# Patient Record
Sex: Female | Born: 1935
Health system: Southern US, Community
[De-identification: ages and names within clinical notes are randomized; demographics above are authoritative.]

## PROBLEM LIST (undated history)

## (undated) DIAGNOSIS — E785 Hyperlipidemia, unspecified: Secondary | ICD-10-CM

## (undated) DIAGNOSIS — K5790 Diverticulosis of intestine, part unspecified, without perforation or abscess without bleeding: Secondary | ICD-10-CM

## (undated) DIAGNOSIS — Z853 Personal history of malignant neoplasm of breast: Secondary | ICD-10-CM

## (undated) DIAGNOSIS — I059 Rheumatic mitral valve disease, unspecified: Secondary | ICD-10-CM

## (undated) DIAGNOSIS — R011 Cardiac murmur, unspecified: Secondary | ICD-10-CM

## (undated) DIAGNOSIS — D696 Thrombocytopenia, unspecified: Secondary | ICD-10-CM

## (undated) DIAGNOSIS — C9110 Chronic lymphocytic leukemia of B-cell type not having achieved remission: Secondary | ICD-10-CM

## (undated) DIAGNOSIS — M2042 Other hammer toe(s) (acquired), left foot: Secondary | ICD-10-CM

## (undated) DIAGNOSIS — M8580 Other specified disorders of bone density and structure, unspecified site: Secondary | ICD-10-CM

## (undated) DIAGNOSIS — I10 Essential (primary) hypertension: Secondary | ICD-10-CM

## (undated) DIAGNOSIS — Z8669 Personal history of other diseases of the nervous system and sense organs: Secondary | ICD-10-CM

## (undated) DIAGNOSIS — Z8781 Personal history of (healed) traumatic fracture: Secondary | ICD-10-CM

## (undated) DIAGNOSIS — R413 Other amnesia: Secondary | ICD-10-CM

## (undated) HISTORY — DX: Personal history of other diseases of the nervous system and sense organs: Z86.69

## (undated) HISTORY — DX: Other amnesia: R41.3

## (undated) HISTORY — DX: Thrombocytopenia, unspecified: D69.6

## (undated) HISTORY — DX: Hyperlipidemia, unspecified: E78.5

## (undated) HISTORY — DX: Essential (primary) hypertension: I10

## (undated) HISTORY — DX: Cardiac murmur, unspecified: R01.1

## (undated) HISTORY — DX: Chronic lymphocytic leukemia of B-cell type not having achieved remission: C91.10

## (undated) HISTORY — DX: Rheumatic mitral valve disease, unspecified: I05.9

## (undated) HISTORY — DX: Personal history of malignant neoplasm of breast: Z85.3

## (undated) HISTORY — DX: Other specified disorders of bone density and structure, unspecified site: M85.80

## (undated) HISTORY — DX: Other hammer toe(s) (acquired), left foot: M20.42

## (undated) HISTORY — DX: Diverticulosis of intestine, part unspecified, without perforation or abscess without bleeding: K57.90

## (undated) HISTORY — DX: Personal history of (healed) traumatic fracture: Z87.81

---

## 1996-12-08 HISTORY — PX: MASTECTOMY: SHX3

## 1998-04-13 ENCOUNTER — Other Ambulatory Visit: Admission: RE | Admit: 1998-04-13 | Discharge: 1998-04-13 | Payer: Self-pay | Admitting: Obstetrics & Gynecology

## 1998-05-18 ENCOUNTER — Ambulatory Visit (HOSPITAL_COMMUNITY): Admission: RE | Admit: 1998-05-18 | Discharge: 1998-05-18 | Payer: Self-pay | Admitting: Internal Medicine

## 1998-09-11 ENCOUNTER — Encounter: Payer: Self-pay | Admitting: Internal Medicine

## 1998-09-11 ENCOUNTER — Ambulatory Visit (HOSPITAL_COMMUNITY): Admission: RE | Admit: 1998-09-11 | Discharge: 1998-09-11 | Payer: Self-pay | Admitting: Internal Medicine

## 1998-11-21 ENCOUNTER — Other Ambulatory Visit: Admission: RE | Admit: 1998-11-21 | Discharge: 1998-11-21 | Payer: Self-pay | Admitting: Hematology and Oncology

## 1999-04-15 ENCOUNTER — Other Ambulatory Visit: Admission: RE | Admit: 1999-04-15 | Discharge: 1999-04-15 | Payer: Self-pay | Admitting: Obstetrics & Gynecology

## 1999-05-22 ENCOUNTER — Encounter: Payer: Self-pay | Admitting: Internal Medicine

## 1999-05-22 ENCOUNTER — Ambulatory Visit (HOSPITAL_COMMUNITY): Admission: RE | Admit: 1999-05-22 | Discharge: 1999-05-22 | Payer: Self-pay | Admitting: Internal Medicine

## 2000-05-11 ENCOUNTER — Other Ambulatory Visit: Admission: RE | Admit: 2000-05-11 | Discharge: 2000-05-11 | Payer: Self-pay | Admitting: Obstetrics & Gynecology

## 2002-02-17 ENCOUNTER — Encounter: Payer: Self-pay | Admitting: Internal Medicine

## 2002-02-17 ENCOUNTER — Encounter: Admission: RE | Admit: 2002-02-17 | Discharge: 2002-02-17 | Payer: Self-pay | Admitting: Internal Medicine

## 2002-05-04 ENCOUNTER — Ambulatory Visit (HOSPITAL_COMMUNITY): Admission: RE | Admit: 2002-05-04 | Discharge: 2002-05-04 | Payer: Self-pay | Admitting: Internal Medicine

## 2002-05-04 ENCOUNTER — Encounter: Admission: RE | Admit: 2002-05-04 | Discharge: 2002-08-02 | Payer: Self-pay | Admitting: Hematology and Oncology

## 2002-11-09 ENCOUNTER — Encounter: Admission: RE | Admit: 2002-11-09 | Discharge: 2002-11-11 | Payer: Self-pay | Admitting: Hematology and Oncology

## 2003-08-23 ENCOUNTER — Other Ambulatory Visit: Admission: RE | Admit: 2003-08-23 | Discharge: 2003-08-23 | Payer: Self-pay | Admitting: Obstetrics & Gynecology

## 2004-09-25 ENCOUNTER — Other Ambulatory Visit: Admission: RE | Admit: 2004-09-25 | Discharge: 2004-09-25 | Payer: Self-pay | Admitting: Obstetrics & Gynecology

## 2004-11-06 ENCOUNTER — Ambulatory Visit: Payer: Self-pay | Admitting: Hematology & Oncology

## 2005-01-31 ENCOUNTER — Ambulatory Visit: Payer: Self-pay | Admitting: Hematology & Oncology

## 2005-02-03 ENCOUNTER — Ambulatory Visit: Payer: Self-pay | Admitting: Hematology & Oncology

## 2005-05-30 ENCOUNTER — Ambulatory Visit: Payer: Self-pay | Admitting: Hematology & Oncology

## 2005-09-19 ENCOUNTER — Ambulatory Visit: Payer: Self-pay | Admitting: Hematology & Oncology

## 2005-10-22 ENCOUNTER — Other Ambulatory Visit: Admission: RE | Admit: 2005-10-22 | Discharge: 2005-10-22 | Payer: Self-pay | Admitting: Obstetrics & Gynecology

## 2006-01-02 ENCOUNTER — Ambulatory Visit: Payer: Self-pay | Admitting: Hematology & Oncology

## 2006-04-29 ENCOUNTER — Ambulatory Visit: Payer: Self-pay | Admitting: Hematology & Oncology

## 2006-05-01 LAB — CBC WITH DIFFERENTIAL/PLATELET
Eosinophils Absolute: 0.1 10*3/uL (ref 0.0–0.5)
HCT: 40.1 % (ref 34.8–46.6)
LYMPH%: 91.2 % — ABNORMAL HIGH (ref 14.0–48.0)
MONO#: 1 10*3/uL — ABNORMAL HIGH (ref 0.1–0.9)
NEUT#: 8.8 10*3/uL — ABNORMAL HIGH (ref 1.5–6.5)
NEUT%: 7.8 % — ABNORMAL LOW (ref 39.6–76.8)
Platelets: 144 10*3/uL — ABNORMAL LOW (ref 145–400)
WBC: 112.4 10*3/uL (ref 3.9–10.0)
lymph#: 102.5 10*3/uL — ABNORMAL HIGH (ref 0.9–3.3)

## 2006-05-01 LAB — CHCC SMEAR

## 2006-05-01 LAB — COMPREHENSIVE METABOLIC PANEL
AST: 19 U/L (ref 0–37)
Alkaline Phosphatase: 71 U/L (ref 39–117)
Glucose, Bld: 80 mg/dL (ref 70–99)
Total Bilirubin: 0.5 mg/dL (ref 0.3–1.2)
Total Protein: 6.1 g/dL (ref 6.0–8.3)

## 2006-05-01 LAB — TECHNOLOGIST REVIEW

## 2006-09-02 ENCOUNTER — Ambulatory Visit: Payer: Self-pay | Admitting: Hematology & Oncology

## 2006-09-04 LAB — CBC WITH DIFFERENTIAL/PLATELET
Basophils Absolute: 0.1 10*3/uL (ref 0.0–0.1)
EOS%: 0.2 % (ref 0.0–7.0)
Eosinophils Absolute: 0.2 10*3/uL (ref 0.0–0.5)
HGB: 13.2 g/dL (ref 11.6–15.9)
LYMPH%: 85.2 % — ABNORMAL HIGH (ref 14.0–48.0)
MCH: 31.6 pg (ref 26.0–34.0)
MCV: 97 fL (ref 81.0–101.0)
MONO%: 0.6 % (ref 0.0–13.0)
NEUT#: 16.6 10*3/uL — ABNORMAL HIGH (ref 1.5–6.5)
Platelets: 145 10*3/uL (ref 145–400)
RBC: 4.17 10*6/uL (ref 3.70–5.32)
RDW: 14.1 % (ref 11.3–14.5)

## 2006-09-04 LAB — COMPREHENSIVE METABOLIC PANEL
Alkaline Phosphatase: 68 U/L (ref 39–117)
BUN: 15 mg/dL (ref 6–23)
CO2: 27 mEq/L (ref 19–32)
Creatinine, Ser: 0.71 mg/dL (ref 0.40–1.20)
Glucose, Bld: 91 mg/dL (ref 70–99)
Total Bilirubin: 0.5 mg/dL (ref 0.3–1.2)

## 2006-09-04 LAB — LACTATE DEHYDROGENASE: LDH: 182 U/L (ref 94–250)

## 2006-12-29 ENCOUNTER — Ambulatory Visit: Payer: Self-pay | Admitting: Hematology & Oncology

## 2007-01-01 LAB — CBC WITH DIFFERENTIAL/PLATELET
Basophils Absolute: 0 10*3/uL (ref 0.0–0.1)
Eosinophils Absolute: 0 10*3/uL (ref 0.0–0.5)
HGB: 13.3 g/dL (ref 11.6–15.9)
LYMPH%: 89.4 % — ABNORMAL HIGH (ref 14.0–48.0)
MCV: 97.5 fL (ref 81.0–101.0)
MONO%: 0.7 % (ref 0.0–13.0)
NEUT#: 11.5 10*3/uL — ABNORMAL HIGH (ref 1.5–6.5)
Platelets: 123 10*3/uL — ABNORMAL LOW (ref 145–400)

## 2007-01-01 LAB — TECHNOLOGIST REVIEW

## 2007-01-02 ENCOUNTER — Emergency Department (HOSPITAL_COMMUNITY): Admission: EM | Admit: 2007-01-02 | Discharge: 2007-01-02 | Payer: Self-pay | Admitting: Emergency Medicine

## 2007-06-30 ENCOUNTER — Ambulatory Visit: Payer: Self-pay | Admitting: Hematology & Oncology

## 2007-07-02 LAB — MANUAL DIFFERENTIAL
ALC: 80.5 10*3/uL — ABNORMAL HIGH (ref 0.9–3.3)
ANC (CHCC manual diff): 2.5 10*3/uL (ref 1.5–6.5)
Band Neutrophils: 0 % (ref 0–10)
Blasts: 0 % (ref 0–0)
LYMPH: 97 % — ABNORMAL HIGH (ref 14–49)
MONO: 0 % (ref 0–14)
Other Cell: 0 % (ref 0–0)
Variant Lymph: 0 % (ref 0–0)
nRBC: 0 % (ref 0–0)

## 2007-07-02 LAB — CBC WITH DIFFERENTIAL/PLATELET
HCT: 38.1 % (ref 34.8–46.6)
MCV: 95.2 fL (ref 81.0–101.0)
Platelets: 140 10*3/uL — ABNORMAL LOW (ref 145–400)
RBC: 4.01 10*6/uL (ref 3.70–5.32)
WBC: 83 10*3/uL (ref 3.9–10.0)

## 2007-07-02 LAB — COMPREHENSIVE METABOLIC PANEL
ALT: 13 U/L (ref 0–35)
BUN: 15 mg/dL (ref 6–23)
CO2: 26 mEq/L (ref 19–32)
Calcium: 9.1 mg/dL (ref 8.4–10.5)
Chloride: 103 mEq/L (ref 96–112)
Creatinine, Ser: 0.73 mg/dL (ref 0.40–1.20)
Glucose, Bld: 78 mg/dL (ref 70–99)
Total Bilirubin: 0.5 mg/dL (ref 0.3–1.2)

## 2007-12-15 ENCOUNTER — Ambulatory Visit: Payer: Self-pay | Admitting: Hematology & Oncology

## 2007-12-17 LAB — CBC WITH DIFFERENTIAL/PLATELET
BASO%: 0.2 % (ref 0.0–2.0)
Eosinophils Absolute: 1.1 10*3/uL — ABNORMAL HIGH (ref 0.0–0.5)
HCT: 40.6 % (ref 34.8–46.6)
MCHC: 33.1 g/dL (ref 32.0–36.0)
MONO#: 0.5 10*3/uL (ref 0.1–0.9)
NEUT#: 26.3 10*3/uL — ABNORMAL HIGH (ref 1.5–6.5)
NEUT%: 25.4 % — ABNORMAL LOW (ref 39.6–76.8)
WBC: 103.3 10*3/uL (ref 3.9–10.0)
lymph#: 75.2 10*3/uL — ABNORMAL HIGH (ref 0.9–3.3)

## 2007-12-17 LAB — COMPREHENSIVE METABOLIC PANEL
ALT: 14 U/L (ref 0–35)
Albumin: 4.3 g/dL (ref 3.5–5.2)
CO2: 27 mEq/L (ref 19–32)
Calcium: 9.2 mg/dL (ref 8.4–10.5)
Chloride: 103 mEq/L (ref 96–112)
Glucose, Bld: 87 mg/dL (ref 70–99)
Sodium: 140 mEq/L (ref 135–145)
Total Bilirubin: 0.6 mg/dL (ref 0.3–1.2)
Total Protein: 6.1 g/dL (ref 6.0–8.3)

## 2008-04-05 ENCOUNTER — Ambulatory Visit: Payer: Self-pay | Admitting: Hematology & Oncology

## 2008-04-07 LAB — MANUAL DIFFERENTIAL
ALC: 99.7 10*3/uL — ABNORMAL HIGH (ref 0.9–3.3)
EOS: 0 % (ref 0–7)
Myelocytes: 0 % (ref 0–0)
Other Cell: 0 % (ref 0–0)
PLT EST: DECREASED
PROMYELO: 0 % (ref 0–0)
SEG: 4 % — ABNORMAL LOW (ref 38–77)
Variant Lymph: 0 % (ref 0–0)

## 2008-04-07 LAB — CHCC SMEAR

## 2008-04-07 LAB — CBC WITH DIFFERENTIAL/PLATELET
HGB: 14 g/dL (ref 11.6–15.9)
RDW: 14.1 % (ref 11.3–14.5)
WBC: 104.9 10*3/uL (ref 3.9–10.0)

## 2008-04-10 LAB — LIPID PANEL
HDL: 46 mg/dL (ref >39)
Total CHOL/HDL Ratio: 4.6 Ratio

## 2008-06-27 ENCOUNTER — Ambulatory Visit: Payer: Self-pay | Admitting: Hematology & Oncology

## 2008-06-30 LAB — CBC WITH DIFFERENTIAL (CANCER CENTER ONLY)
HGB: 13.7 g/dL (ref 11.6–15.9)
MCHC: 33.3 g/dL (ref 32.0–36.0)
Platelets: 152 10*3/uL (ref 145–400)
RDW: 11 % (ref 10.5–14.6)

## 2008-06-30 LAB — COMPREHENSIVE METABOLIC PANEL
ALT: 14 U/L (ref 0–35)
CO2: 23 mEq/L (ref 19–32)
Calcium: 9.1 mg/dL (ref 8.4–10.5)
Chloride: 106 mEq/L (ref 96–112)
Creatinine, Ser: 0.72 mg/dL (ref 0.40–1.20)
Glucose, Bld: 116 mg/dL — ABNORMAL HIGH (ref 70–99)
Total Protein: 6.2 g/dL (ref 6.0–8.3)

## 2008-06-30 LAB — MANUAL DIFFERENTIAL (CHCC SATELLITE)
ANC (CHCC HP manual diff): 4.2 10*3/uL (ref 1.5–6.7)
PLT EST ~~LOC~~: ADEQUATE
SEG: 5 % — ABNORMAL LOW (ref 40–75)

## 2008-06-30 LAB — CHCC SATELLITE - SMEAR

## 2008-10-24 ENCOUNTER — Ambulatory Visit: Payer: Self-pay | Admitting: Hematology & Oncology

## 2008-10-25 LAB — CBC WITH DIFFERENTIAL (CANCER CENTER ONLY)
MCV: 96 fL (ref 81–101)
Platelets: 138 10*3/uL — ABNORMAL LOW (ref 145–400)
RDW: 11 % (ref 10.5–14.6)
WBC: 90.1 10*3/uL (ref 3.9–10.0)

## 2008-10-25 LAB — MANUAL DIFFERENTIAL (CHCC SATELLITE)
ANC (CHCC HP manual diff): 7.2 10*3/uL — ABNORMAL HIGH (ref 1.5–6.7)
LYMPH: 91 % — ABNORMAL HIGH (ref 14–48)
PLT EST ~~LOC~~: DECREASED

## 2008-10-25 LAB — CHCC SATELLITE - SMEAR

## 2009-03-13 ENCOUNTER — Ambulatory Visit: Payer: Self-pay | Admitting: Hematology & Oncology

## 2009-03-14 LAB — COMPREHENSIVE METABOLIC PANEL
ALT: 14 U/L (ref 0–35)
Albumin: 4.5 g/dL (ref 3.5–5.2)
CO2: 26 mEq/L (ref 19–32)
Calcium: 9.6 mg/dL (ref 8.4–10.5)
Chloride: 102 mEq/L (ref 96–112)
Glucose, Bld: 114 mg/dL — ABNORMAL HIGH (ref 70–99)
Potassium: 4.3 mEq/L (ref 3.5–5.3)
Sodium: 139 mEq/L (ref 135–145)
Total Bilirubin: 0.5 mg/dL (ref 0.3–1.2)
Total Protein: 6.1 g/dL (ref 6.0–8.3)

## 2009-03-14 LAB — MANUAL DIFFERENTIAL (CHCC SATELLITE)
LYMPH: 92 % — ABNORMAL HIGH (ref 14–48)
MONO: 1 % (ref 0–13)
PLT EST ~~LOC~~: DECREASED
Platelet Morphology: NORMAL

## 2009-03-14 LAB — CBC WITH DIFFERENTIAL (CANCER CENTER ONLY)
HGB: 13.3 g/dL (ref 11.6–15.9)
MCH: 32.6 pg (ref 26.0–34.0)
MCHC: 33 g/dL (ref 32.0–36.0)

## 2009-07-12 ENCOUNTER — Ambulatory Visit: Payer: Self-pay | Admitting: Hematology & Oncology

## 2009-07-13 LAB — CBC WITH DIFFERENTIAL (CANCER CENTER ONLY)
HCT: 39.8 % (ref 34.8–46.6)
HGB: 13.2 g/dL (ref 11.6–15.9)
RBC: 4.09 10*6/uL (ref 3.70–5.32)
WBC: 90.5 10*3/uL (ref 3.9–10.0)

## 2009-07-13 LAB — MANUAL DIFFERENTIAL (CHCC SATELLITE)
ALC: 83.3 10*3/uL — ABNORMAL HIGH (ref 0.6–2.2)
ANC (CHCC HP manual diff): 4.5 10*3/uL (ref 1.5–6.7)
Band Neutrophils: 0 % (ref 0–10)
LYMPH: 92 % — ABNORMAL HIGH (ref 14–48)
PLT EST ~~LOC~~: DECREASED

## 2009-07-13 LAB — CHCC SATELLITE - SMEAR

## 2009-07-16 LAB — COMPREHENSIVE METABOLIC PANEL
AST: 21 U/L (ref 0–37)
Albumin: 4.3 g/dL (ref 3.5–5.2)
Alkaline Phosphatase: 64 U/L (ref 39–117)
Calcium: 9.2 mg/dL (ref 8.4–10.5)
Chloride: 103 mEq/L (ref 96–112)
Glucose, Bld: 106 mg/dL — ABNORMAL HIGH (ref 70–99)
Potassium: 4.4 mEq/L (ref 3.5–5.3)
Sodium: 137 mEq/L (ref 135–145)
Total Protein: 6 g/dL (ref 6.0–8.3)

## 2009-11-27 ENCOUNTER — Ambulatory Visit: Payer: Self-pay | Admitting: Hematology & Oncology

## 2009-11-28 LAB — CBC WITH DIFFERENTIAL (CANCER CENTER ONLY)
MCV: 94 fL (ref 81–101)
Platelets: 108 10*3/uL — ABNORMAL LOW (ref 145–400)
RBC: 4.1 10*6/uL (ref 3.70–5.32)
RDW: 11.2 % (ref 10.5–14.6)
WBC: 82.9 10*3/uL (ref 3.9–10.0)

## 2009-11-28 LAB — MANUAL DIFFERENTIAL (CHCC SATELLITE)
ALC: 77.9 10*3/uL — ABNORMAL HIGH (ref 0.6–2.2)
ANC (CHCC HP manual diff): 5 10*3/uL (ref 1.5–6.7)
SEG: 6 % — ABNORMAL LOW (ref 40–75)

## 2009-11-29 LAB — COMPREHENSIVE METABOLIC PANEL
Albumin: 4.5 g/dL (ref 3.5–5.2)
Alkaline Phosphatase: 63 U/L (ref 39–117)
BUN: 19 mg/dL (ref 6–23)
CO2: 23 mEq/L (ref 19–32)
Calcium: 8.9 mg/dL (ref 8.4–10.5)
Chloride: 102 mEq/L (ref 96–112)
Glucose, Bld: 111 mg/dL — ABNORMAL HIGH (ref 70–99)
Potassium: 4.2 mEq/L (ref 3.5–5.3)
Total Protein: 6.1 g/dL (ref 6.0–8.3)

## 2009-11-29 LAB — VITAMIN D 25 HYDROXY (VIT D DEFICIENCY, FRACTURES): Vit D, 25-Hydroxy: 40 ng/mL (ref 30–89)

## 2010-04-09 ENCOUNTER — Ambulatory Visit: Payer: Self-pay | Admitting: Hematology & Oncology

## 2010-04-10 LAB — CBC WITH DIFFERENTIAL (CANCER CENTER ONLY)
MCH: 31.6 pg (ref 26.0–34.0)
Platelets: 108 10*3/uL — ABNORMAL LOW (ref 145–400)
RBC: 4.01 10*6/uL (ref 3.70–5.32)
WBC: 40 10*3/uL — ABNORMAL HIGH (ref 3.9–10.0)

## 2010-04-10 LAB — CHCC SATELLITE - SMEAR

## 2010-04-10 LAB — MANUAL DIFFERENTIAL (CHCC SATELLITE)
ALC: 36 10*3/uL — ABNORMAL HIGH (ref 0.6–2.2)
ANC (CHCC HP manual diff): 2.4 10*3/uL (ref 1.5–6.7)
LYMPH: 90 % — ABNORMAL HIGH (ref 14–48)
MONO: 4 % (ref 0–13)

## 2010-08-20 ENCOUNTER — Ambulatory Visit: Payer: Self-pay | Admitting: Hematology & Oncology

## 2010-08-22 LAB — MANUAL DIFFERENTIAL (CHCC SATELLITE)
ALC: 86.5 10*3/uL — ABNORMAL HIGH (ref 0.6–2.2)
SEG: 3 % — ABNORMAL LOW (ref 40–75)

## 2010-08-22 LAB — CBC WITH DIFFERENTIAL (CANCER CENTER ONLY)
HGB: 13.8 g/dL (ref 11.6–15.9)
MCV: 98 fL (ref 81–101)
Platelets: 115 10*3/uL — ABNORMAL LOW (ref 145–400)
RBC: 4.23 10*6/uL (ref 3.70–5.32)
WBC: 90.1 10*3/uL (ref 3.9–10.0)

## 2010-08-22 LAB — CHCC SATELLITE - SMEAR

## 2010-08-26 LAB — COMPREHENSIVE METABOLIC PANEL
Albumin: 4.6 g/dL (ref 3.5–5.2)
BUN: 15 mg/dL (ref 6–23)
Calcium: 9.3 mg/dL (ref 8.4–10.5)
Chloride: 105 mEq/L (ref 96–112)
Glucose, Bld: 99 mg/dL (ref 70–99)
Potassium: 4.2 mEq/L (ref 3.5–5.3)

## 2010-08-26 LAB — PROTEIN ELECTROPHORESIS, SERUM
Albumin ELP: 71.9 % — ABNORMAL HIGH (ref 55.8–66.1)
Total Protein, Serum Electrophoresis: 6.2 g/dL (ref 6.0–8.3)

## 2010-08-26 LAB — LACTATE DEHYDROGENASE: LDH: 179 U/L (ref 94–250)

## 2010-12-27 ENCOUNTER — Ambulatory Visit: Payer: Self-pay | Admitting: Hematology & Oncology

## 2010-12-30 LAB — CBC WITH DIFFERENTIAL (CANCER CENTER ONLY)
HCT: 42.4 % (ref 34.8–46.6)
HGB: 14.3 g/dL (ref 11.6–15.9)
MCH: 32.8 pg (ref 26.0–34.0)
MCHC: 33.8 g/dL (ref 32.0–36.0)
MCV: 97 fL (ref 81–101)
Platelets: 109 10*3/uL — ABNORMAL LOW (ref 145–400)
RBC: 4.36 10*6/uL (ref 3.70–5.32)
RDW: 10.8 % (ref 10.5–14.6)
WBC: 66.4 10*3/uL (ref 3.9–10.0)

## 2010-12-30 LAB — MANUAL DIFFERENTIAL (CHCC SATELLITE)
ALC: 59.8 10*3/uL — ABNORMAL HIGH (ref 0.6–2.2)
ANC (CHCC HP manual diff): 5.3 10*3/uL (ref 1.5–6.7)
Eos: 1 % (ref 0–7)
LYMPH: 90 % — ABNORMAL HIGH (ref 14–48)
MONO: 1 % (ref 0–13)
PLT EST ~~LOC~~: DECREASED
Platelet Morphology: NORMAL
SEG: 8 % — ABNORMAL LOW (ref 40–75)

## 2010-12-30 LAB — CHCC SATELLITE - SMEAR

## 2010-12-31 LAB — COMPREHENSIVE METABOLIC PANEL
Albumin: 4.6 g/dL (ref 3.5–5.2)
Alkaline Phosphatase: 60 U/L (ref 39–117)
BUN: 20 mg/dL (ref 6–23)
Calcium: 9.6 mg/dL (ref 8.4–10.5)
Creatinine, Ser: 0.64 mg/dL (ref 0.40–1.20)
Glucose, Bld: 110 mg/dL — ABNORMAL HIGH (ref 70–99)
Potassium: 4.4 mEq/L (ref 3.5–5.3)

## 2011-04-14 ENCOUNTER — Encounter (HOSPITAL_BASED_OUTPATIENT_CLINIC_OR_DEPARTMENT_OTHER): Payer: Medicare Other | Admitting: Hematology & Oncology

## 2011-04-14 ENCOUNTER — Other Ambulatory Visit: Payer: Self-pay | Admitting: Hematology & Oncology

## 2011-04-14 DIAGNOSIS — C9110 Chronic lymphocytic leukemia of B-cell type not having achieved remission: Secondary | ICD-10-CM

## 2011-04-14 DIAGNOSIS — Z853 Personal history of malignant neoplasm of breast: Secondary | ICD-10-CM

## 2011-04-14 LAB — MANUAL DIFFERENTIAL (CHCC SATELLITE)
BASO: 1 % (ref 0–2)
LYMPH: 93 % — ABNORMAL HIGH (ref 14–48)
PLT EST ~~LOC~~: DECREASED

## 2011-04-14 LAB — CBC WITH DIFFERENTIAL (CANCER CENTER ONLY)
HGB: 13.5 g/dL (ref 11.6–15.9)
MCH: 32.1 pg (ref 26.0–34.0)
MCV: 94 fL (ref 81–101)
RBC: 4.2 10*6/uL (ref 3.70–5.32)

## 2011-07-28 ENCOUNTER — Other Ambulatory Visit: Payer: Self-pay | Admitting: Hematology & Oncology

## 2011-07-28 ENCOUNTER — Encounter (HOSPITAL_BASED_OUTPATIENT_CLINIC_OR_DEPARTMENT_OTHER): Payer: Medicare Other | Admitting: Hematology & Oncology

## 2011-07-28 DIAGNOSIS — C9110 Chronic lymphocytic leukemia of B-cell type not having achieved remission: Secondary | ICD-10-CM

## 2011-07-28 DIAGNOSIS — Z853 Personal history of malignant neoplasm of breast: Secondary | ICD-10-CM

## 2011-07-28 LAB — CHCC SATELLITE - SMEAR

## 2011-07-28 LAB — CBC WITH DIFFERENTIAL (CANCER CENTER ONLY)
HGB: 13.5 g/dL (ref 11.6–15.9)
MCH: 33.2 pg (ref 26.0–34.0)
MCHC: 34.2 g/dL (ref 32.0–36.0)
Platelets: 93 10*3/uL — ABNORMAL LOW (ref 145–400)

## 2011-07-28 LAB — MANUAL DIFFERENTIAL (CHCC SATELLITE): LYMPH: 90 % — ABNORMAL HIGH (ref 14–48)

## 2011-07-30 LAB — PROTEIN ELECTROPHORESIS, SERUM
Albumin ELP: 68.5 % — ABNORMAL HIGH (ref 55.8–66.1)
Alpha-1-Globulin: 5.6 % — ABNORMAL HIGH (ref 2.9–4.9)
Beta 2: 3.5 % (ref 3.2–6.5)
Beta Globulin: 5.8 % (ref 4.7–7.2)

## 2011-07-30 LAB — BETA 2 MICROGLOBULIN, SERUM: Beta-2 Microglobulin: 1.95 mg/L — ABNORMAL HIGH (ref 1.01–1.73)

## 2011-10-23 ENCOUNTER — Other Ambulatory Visit (HOSPITAL_BASED_OUTPATIENT_CLINIC_OR_DEPARTMENT_OTHER): Payer: Medicare Other | Admitting: Lab

## 2011-10-23 ENCOUNTER — Other Ambulatory Visit: Payer: Self-pay | Admitting: Hematology & Oncology

## 2011-10-23 ENCOUNTER — Ambulatory Visit (HOSPITAL_BASED_OUTPATIENT_CLINIC_OR_DEPARTMENT_OTHER): Payer: Medicare Other | Admitting: Hematology & Oncology

## 2011-10-23 DIAGNOSIS — C9110 Chronic lymphocytic leukemia of B-cell type not having achieved remission: Secondary | ICD-10-CM

## 2011-10-23 DIAGNOSIS — Z853 Personal history of malignant neoplasm of breast: Secondary | ICD-10-CM

## 2011-10-23 DIAGNOSIS — M810 Age-related osteoporosis without current pathological fracture: Secondary | ICD-10-CM

## 2011-10-23 DIAGNOSIS — C50919 Malignant neoplasm of unspecified site of unspecified female breast: Secondary | ICD-10-CM

## 2011-10-23 LAB — CHCC SATELLITE - SMEAR

## 2011-10-23 LAB — RETICULOCYTES (CHCC)
ABS Retic: 65 10*3/uL (ref 19.0–186.0)
RBC.: 4.33 MIL/uL (ref 3.87–5.11)
RBC.: 4.33 MIL/uL (ref 3.87–5.11)
Retic Ct Pct: 1.5 % (ref 0.4–2.3)

## 2011-10-23 LAB — COMPREHENSIVE METABOLIC PANEL
ALT: 18 U/L (ref 0–35)
AST: 23 U/L (ref 0–37)
Albumin: 4.7 g/dL (ref 3.5–5.2)
BUN: 18 mg/dL (ref 6–23)
CO2: 21 mEq/L (ref 19–32)
Calcium: 9.9 mg/dL (ref 8.4–10.5)
Chloride: 104 mEq/L (ref 96–112)
Creatinine, Ser: 0.75 mg/dL (ref 0.50–1.10)
Potassium: 4.5 mEq/L (ref 3.5–5.3)

## 2011-10-23 LAB — CBC WITH DIFFERENTIAL (CANCER CENTER ONLY)
MCV: 97 fL (ref 81–101)
Platelets: 109 10*3/uL — ABNORMAL LOW (ref 145–400)
RBC: 4.29 10*6/uL (ref 3.70–5.32)
WBC: 74.7 10*3/uL (ref 3.9–10.0)

## 2011-10-23 LAB — MANUAL DIFFERENTIAL (CHCC SATELLITE)
PLT EST ~~LOC~~: DECREASED
SEG: 5 % — ABNORMAL LOW (ref 40–75)

## 2011-10-23 NOTE — Progress Notes (Signed)
This office note has been dictated.

## 2011-10-23 NOTE — Progress Notes (Signed)
Patient instructed to bring all meds in next visit. Heather Knapp, [name]

## 2011-10-24 LAB — COMPREHENSIVE METABOLIC PANEL
AST: 23 U/L (ref 0–37)
Albumin: 4.7 g/dL (ref 3.5–5.2)
Albumin: 4.7 g/dL (ref 3.5–5.2)
Alkaline Phosphatase: 65 U/L (ref 39–117)
Alkaline Phosphatase: 65 U/L (ref 39–117)
BUN: 18 mg/dL (ref 6–23)
BUN: 18 mg/dL (ref 6–23)
Glucose, Bld: 91 mg/dL (ref 70–99)
Potassium: 4.5 mEq/L (ref 3.5–5.3)
Sodium: 141 mEq/L (ref 135–145)
Total Bilirubin: 0.7 mg/dL (ref 0.3–1.2)
Total Bilirubin: 0.7 mg/dL (ref 0.3–1.2)

## 2011-10-24 LAB — RETICULOCYTES (CHCC)
RBC.: 4.33 MIL/uL (ref 3.87–5.11)
Retic Ct Pct: 1.5 % (ref 0.4–2.3)
Retic Ct Pct: 1.5 % (ref 0.4–2.3)

## 2011-10-24 LAB — BETA 2 MICROGLOBULIN, SERUM: Beta-2 Microglobulin: 1.96 mg/L — ABNORMAL HIGH (ref 1.01–1.73)

## 2011-10-24 LAB — VITAMIN D 25 HYDROXY (VIT D DEFICIENCY, FRACTURES): Vit D, 25-Hydroxy: 67 ng/mL (ref 30–89)

## 2011-10-27 NOTE — Progress Notes (Signed)
CC:   Dossie Der, MD  DIAGNOSES: 1. Stage A chronic lymphocytic leukemia. 2. Stage I infiltrating ductal carcinoma of the left breast.  CURRENT THERAPY:  Observation.  INTERIM HISTORY:  Ms. Heather Knapp comes in for follow-up.  We had a tough time getting blood from her today.  Thankfully, Gwen finally got some from her foot.  She is doing okay.  She is still not able to find a job.  It sounds like she just may not continue to look for a job.  She has had no problems over the late summer or early fall.  She has had no fever.  She has had no rashes.  There has been no change in bowel or bladder habits.  She has had no cough or shortness of breath.  PHYSICAL EXAMINATION:  General Appearance:  This is a well-developed, well-nourished white female in no obvious distress.  Vital Signs:  97.3, pulse 69, respiratory rate 20, blood pressure 146/73, weight is 160. Head and Neck Exam:  Shows a normocephalic and atraumatic skull.  There are no ocular or oral lesions.  There are no palpable cervical or supraclavicular lymph nodes.  Lungs:  Clear to percussion and auscultation bilaterally. Cardiac Exam:  Regular rate and rhythm with a normal S1 and S2.  There are no murmurs, rubs, or bruits.  Breast Exam:  Shows fibrosis with no masses, edema or erythema.  There is no right axillary adenopathy.  Left chest wall shows a well-healed mastectomy.  No left chest wall nodules, erythema or swelling are noted.  There is no left axillary adenopathy. Abdominal Exam:  Soft with good bowel sounds.  There is no palpable abdominal mass.  There is no fluid wave.  No palpable hepatosplenomegaly is noted.  Extremities:  Show no clubbing, cyanosis or edema.  No lymphedema is noted in the left arm.  Skin Exam:  No rashes, ecchymosis, or petechia.  LABORATORY STUDIES:  White cell count is 74.7, hemoglobin 14.2, hematocrit 41.7, platelet count 109.  White cell differential shows 5% polys, 95%  lymphocytes.  IMPRESSION:  Ms. Pluta is a 77 year old white female with a remote history of stage IA infiltrating ductal carcinoma of the left breast. This was not an issue from my point of view.  She completed tamoxifen back in July 2003.  We tried to get her to let us draw blood from the left arm.  She would not let us do this as she is very worried over developing lymphedema.  Again, we did get blood from her foot.  Her CLL is not an issue.  Her white cell count does tend to fluctuate.  Her platelet count is better.  This has given some peace of mind to [name].  Again, I do not see that we need to intervene with the CLL.  We have been following this now for I think 7 or 8 years.  I will plan to get her back in 3-4 months.  We will try to get her through the wintertime before she has to come back to see [place].    ______________________________ Josph Macho, M.D. PRE/MEDQ  D:  10/23/2011  T:  10/24/2011  Job:  [id number]

## 2012-01-23 ENCOUNTER — Other Ambulatory Visit (HOSPITAL_BASED_OUTPATIENT_CLINIC_OR_DEPARTMENT_OTHER): Payer: Medicare Other | Admitting: Lab

## 2012-01-23 ENCOUNTER — Ambulatory Visit (HOSPITAL_BASED_OUTPATIENT_CLINIC_OR_DEPARTMENT_OTHER): Payer: Medicare Other | Admitting: Hematology & Oncology

## 2012-01-23 VITALS — BP 142/70 | HR 63 | Temp 96.9°F | Ht 63.25 in | Wt 162.0 lb

## 2012-01-23 DIAGNOSIS — C9110 Chronic lymphocytic leukemia of B-cell type not having achieved remission: Secondary | ICD-10-CM

## 2012-01-23 DIAGNOSIS — M810 Age-related osteoporosis without current pathological fracture: Secondary | ICD-10-CM

## 2012-01-23 DIAGNOSIS — L851 Acquired keratosis [keratoderma] palmaris et plantaris: Secondary | ICD-10-CM

## 2012-01-23 DIAGNOSIS — Z853 Personal history of malignant neoplasm of breast: Secondary | ICD-10-CM

## 2012-01-23 LAB — CBC WITH DIFFERENTIAL (CANCER CENTER ONLY)
MCV: 97 fL (ref 81–101)
Platelets: 95 10*3/uL — ABNORMAL LOW (ref 145–400)
RBC: 4.1 10*6/uL (ref 3.70–5.32)
WBC: 60.2 10*3/uL (ref 3.9–10.0)

## 2012-01-23 LAB — CHCC SATELLITE - SMEAR

## 2012-01-23 LAB — MANUAL DIFFERENTIAL (CHCC SATELLITE)
ALC: 54.8 10*3/uL — ABNORMAL HIGH (ref 0.6–2.2)
ANC (CHCC HP manual diff): 5.4 10*3/uL (ref 1.5–6.7)
SEG: 9 % — ABNORMAL LOW (ref 40–75)

## 2012-01-23 NOTE — Progress Notes (Signed)
This office note has been dictated.

## 2012-01-24 LAB — COMPREHENSIVE METABOLIC PANEL
AST: 23 U/L (ref 0–37)
Alkaline Phosphatase: 61 U/L (ref 39–117)
BUN: 15 mg/dL (ref 6–23)
Creatinine, Ser: 0.75 mg/dL (ref 0.50–1.10)
Potassium: 4 mEq/L (ref 3.5–5.3)
Total Bilirubin: 0.6 mg/dL (ref 0.3–1.2)

## 2012-01-26 ENCOUNTER — Telehealth: Payer: Self-pay | Admitting: Hematology & Oncology

## 2012-01-26 NOTE — Telephone Encounter (Signed)
Mailed 05-06-12 schedule to patient

## 2012-01-27 ENCOUNTER — Encounter: Payer: Self-pay | Admitting: *Deleted

## 2012-01-27 NOTE — Progress Notes (Signed)
CC:   Heather Rainier, MD, Fax [phone]  DIAGNOSES: 1. Stage A CLL. 2. Stage I (T1 N0 M0) ductal carcinoma of the left breast.  CURRENT THERAPY:  Observation.  INTERIM HISTORY:  Heather Knapp comes in for follow-up.  We last saw her back in November.  She has had no problems since we last saw her.  She has had no fevers, sweats or chills.  She has had no bleeding or bruising.  She is exercising more.  She has had no change in bowel or bladder habits.  Her joints have been doing okay.  She is still looking for a job.  So far nothing has become available.  She will be going to [place] in the fall and going on a cruise to [place].  She is looking forward to this.  She has not noted any swelling with respect to her right breast.  She has not noticed any swollen lymph glands.  PHYSICAL EXAMINATION:  General Appearance:  This is a well-developed, well-nourished white female in no obvious distress.  Vital Signs:  Show a temperature of 96.9, pulse 63, respiratory rate 14, blood pressure 142/70, weight of 162.  Head and Neck Exam:  Shows a normocephalic, atraumatic skull.  There are no ocular or oral lesions.  There are no palpable cervical or supraclavicular lymph nodes.  Lungs:  Clear bilaterally.  Cardiac Exam:  Regular rate and rhythm with a normal S1 and S2.  There are no murmurs, rubs or bruits. Breast Exam: Shows the right breast with no masses, edema or erythema. There is no right axillary adenopathy.  Left chest wall shows well- healed mastectomy.  There is no erythema or nodule on the left anterior chest wall.  There is no left axillary adenopathy.  Abdominal Exam: Soft with good bowel sounds.  There is no palpable abdominal mass. There is no fluid wave.  There is no palpable hepatosplenomegaly.  Back Exam:  No tenderness over the spine, ribs or hips.  There is no kyphosis or osteoporotic changes.  Extremities:  Show no clubbing, cyanosis or edema.  Skin Exam:  No rashes,  ecchymosis, or petechia.  She does have some seborrheic keratoses.  LABORATORY STUDIES:  White cell count is 60.2, hemoglobin 13.4, hematocrit 39.6, platelet count is 95.  White cell differential shows 9% segs, 91 lymphocytes.  Her sodium is 138, potassium 4, BUN 15, creatinine 0.75.  LFTs are normal.  IMPRESSION:  Heather Knapp is a 78 year old white female with stage A CLL. This is the "active" disease that we are dealing with.  So far, her white cell count has actually gone down.  She is feeling well.  I do not see any indication that we need to start treatment.  As far as the breast cancer is concerned, this is not a problem.  I think she is out from her breast cancer by 10 years.  She completed treatment back in July 2003.  We will plan to get her back in about 3 or 4 months now.  I do not see that we have got to do blood work in between visits.    ______________________________ Josph Macho, M.D. PRE/MEDQ  D:  01/23/2012  T:  01/24/2012  Job:  [id number]

## 2012-01-27 NOTE — Progress Notes (Signed)
Per Dr. Myna Hidalgo, pt called and told her Vit D and C Met were all ok.  Pt voiced understanding.  Also mailed results to pt.

## 2012-05-06 ENCOUNTER — Ambulatory Visit (HOSPITAL_BASED_OUTPATIENT_CLINIC_OR_DEPARTMENT_OTHER): Payer: Medicare Other | Admitting: Hematology & Oncology

## 2012-05-06 ENCOUNTER — Other Ambulatory Visit (HOSPITAL_BASED_OUTPATIENT_CLINIC_OR_DEPARTMENT_OTHER): Payer: Medicare Other | Admitting: Lab

## 2012-05-06 VITALS — BP 141/73 | HR 81 | Temp 97.1°F | Ht 63.0 in | Wt 158.0 lb

## 2012-05-06 DIAGNOSIS — C9110 Chronic lymphocytic leukemia of B-cell type not having achieved remission: Secondary | ICD-10-CM

## 2012-05-06 DIAGNOSIS — Z853 Personal history of malignant neoplasm of breast: Secondary | ICD-10-CM

## 2012-05-06 DIAGNOSIS — C50912 Malignant neoplasm of unspecified site of left female breast: Secondary | ICD-10-CM

## 2012-05-06 LAB — CBC WITH DIFFERENTIAL (CANCER CENTER ONLY)
BASO#: 0.1 10*3/uL (ref 0.0–0.2)
Eosinophils Absolute: 0 10*3/uL (ref 0.0–0.5)
HCT: 40.7 % (ref 34.8–46.6)
HGB: 13.7 g/dL (ref 11.6–15.9)
LYMPH%: 94.9 % — ABNORMAL HIGH (ref 14.0–48.0)
MCH: 32.9 pg (ref 26.0–34.0)
MCV: 98 fL (ref 81–101)
MONO%: 1.3 % (ref 0.0–13.0)
RBC: 4.17 10*6/uL (ref 3.70–5.32)

## 2012-05-06 LAB — COMPREHENSIVE METABOLIC PANEL
Albumin: 4.6 g/dL (ref 3.5–5.2)
BUN: 19 mg/dL (ref 6–23)
Calcium: 9.5 mg/dL (ref 8.4–10.5)
Chloride: 103 mEq/L (ref 96–112)
Creatinine, Ser: 0.84 mg/dL (ref 0.50–1.10)
Glucose, Bld: 103 mg/dL — ABNORMAL HIGH (ref 70–99)
Potassium: 4.6 mEq/L (ref 3.5–5.3)

## 2012-05-06 NOTE — Progress Notes (Signed)
This office note has been dictated.

## 2012-05-07 NOTE — Progress Notes (Signed)
CC:   Juluis Rainier, M.D., Fax [phone]  DIAGNOSES: 1. Stage A chronic lymphocytic leukemia. 2. History of stage I (T1 N0 M0) ductal carcinoma of the left breast.  CURRENT THERAPY:  Observation.  INTERVAL HISTORY:  Heather Knapp comes in for followup.  She did have a little bit of a financial incident since we last saw her.  She was "scammed" for $13,000.  Thankfully, her bank was very, very kind and good, and made sure that she was able to get her money back.  She has otherwise had no issues.  She has had no fever.  She has had no change in bowel or bladder habits.  She is trying to exercise.  She has had no bleeding or bruising.  She has had no fever.  PHYSICAL EXAM:  This is a well-developed, well-nourished white female in no obvious distress.  Vital signs:  Temperature 97.1, pulse 81, respiratory rate 18, blood pressure 141/73, weight is 158.  Head/Neck: A normocephalic and atraumatic skull.  There are no ocular or oral lesions.  There are no palpable cervical or supraclavicular lymph nodes. Lungs:  Clear to percussion and auscultation bilaterally.  Cardiac: Regular rate and rhythm with normal S1, S2.  There are no murmurs, rubs or bruits.  Abdomen:  Soft with good bowel sounds.  There is no palpable abdominal mass.  No palpable hepatosplenomegaly.  Extremities:  No clubbing, cyanosis or edema.  Neurological:  No focal neurological deficits.  Skin:  No rashes, ecchymoses or petechia.  LABORATORY STUDIES:  White cell count is 76.4, hemoglobin is 13.7, hematocrit 40.7, platelet count 128.  White cell differential shows 4% segs and 95% lymphocytes.  IMPRESSION:  Heather Knapp is a 78 year old white female with longstanding chronic lymphocytic leukemia.  I still consider her a stage A.  From the standpoint of CLL, she is not having any issues.  This, we can just follow.  As far as her breast cancer is concerned, she completed her treatments back in July 2003.  There is no  evidence of recurrence.  I think we can probably get her back now after the Labor Day holiday.    ______________________________ Josph Macho, M.D. PRE/MEDQ  D:  05/06/2012  T:  05/07/2012  Job:  [id number]

## 2012-05-11 ENCOUNTER — Telehealth: Payer: Self-pay | Admitting: *Deleted

## 2012-05-11 NOTE — Telephone Encounter (Addendum)
Message copied by Mirian Capuchin on Tue May 11, 2012  4:59 PM ------      Message from: Josph Macho      Created: Tue May 11, 2012  8:50 AM       Please call and let her know labs are ok.  Plesase mail her the C-met results.  Cindee Lame This message and labs mailed to pt.

## 2012-08-16 ENCOUNTER — Ambulatory Visit (HOSPITAL_BASED_OUTPATIENT_CLINIC_OR_DEPARTMENT_OTHER): Payer: Medicare Other | Admitting: Hematology & Oncology

## 2012-08-16 ENCOUNTER — Other Ambulatory Visit (HOSPITAL_BASED_OUTPATIENT_CLINIC_OR_DEPARTMENT_OTHER): Payer: Medicare Other | Admitting: Lab

## 2012-08-16 VITALS — BP 151/63 | HR 73 | Temp 98.4°F | Resp 18 | Ht 63.0 in | Wt 158.0 lb

## 2012-08-16 DIAGNOSIS — C50912 Malignant neoplasm of unspecified site of left female breast: Secondary | ICD-10-CM

## 2012-08-16 DIAGNOSIS — C50919 Malignant neoplasm of unspecified site of unspecified female breast: Secondary | ICD-10-CM

## 2012-08-16 DIAGNOSIS — C9110 Chronic lymphocytic leukemia of B-cell type not having achieved remission: Secondary | ICD-10-CM

## 2012-08-16 LAB — COMPREHENSIVE METABOLIC PANEL
AST: 17 U/L (ref 0–37)
Albumin: 4.5 g/dL (ref 3.5–5.2)
BUN: 17 mg/dL (ref 6–23)
CO2: 27 mEq/L (ref 19–32)
Calcium: 9.5 mg/dL (ref 8.4–10.5)
Chloride: 106 mEq/L (ref 96–112)
Creatinine, Ser: 0.78 mg/dL (ref 0.50–1.10)
Glucose, Bld: 84 mg/dL (ref 70–99)
Potassium: 4.3 mEq/L (ref 3.5–5.3)

## 2012-08-16 LAB — CBC WITH DIFFERENTIAL (CANCER CENTER ONLY)
BASO#: 0.1 10*3/uL (ref 0.0–0.2)
BASO%: 0.1 % (ref 0.0–2.0)
Eosinophils Absolute: 0.1 10*3/uL (ref 0.0–0.5)
HCT: 42.1 % (ref 34.8–46.6)
HGB: 13.8 g/dL (ref 11.6–15.9)
LYMPH#: 56.2 10*3/uL — ABNORMAL HIGH (ref 0.9–3.3)
LYMPH%: 95.2 % — ABNORMAL HIGH (ref 14.0–48.0)
MCV: 99 fL (ref 81–101)
MONO#: 0.7 10*3/uL (ref 0.1–0.9)
NEUT%: 3.5 % — ABNORMAL LOW (ref 39.6–80.0)
RBC: 4.25 10*6/uL (ref 3.70–5.32)
RDW: 13.4 % (ref 11.1–15.7)
WBC: 59 10*3/uL (ref 3.9–10.0)

## 2012-08-16 LAB — LACTATE DEHYDROGENASE: LDH: 153 U/L (ref 94–250)

## 2012-08-16 NOTE — Progress Notes (Signed)
This office note has been dictated.

## 2012-08-17 NOTE — Progress Notes (Signed)
CC:   Juluis Rainier, MD, Fax [phone]  DIAGNOSES: 1. Stage A chronic lymphocytic leukemia. 2. History of stage I infiltrating ductal carcinoma left breast.  CURRENT THERAPY:  Observation.  INTERIM HISTORY:  Ms. Killings comes in for followup.  She is doing quite well.  She has had a good summer. She is getting ready to go to [place] for a cruise. She leaves in a  couple of weeks.  I told her to make sure that she drinks a lot of water.  I also recommend that she take a baby aspirin.  She is worried about taking a baby aspirin.  Otherwise, she has had no problems. There has been no fever over the summer.  She has had no bleeding.  She has had no change in bowel or bladder habits.  There has been no cough.  PHYSICAL EXAMINATION:  This is a well-developed, well-nourished white female in no obvious distress.  Vital signs:  Temperature of 98.4, pulse 73, respiratory rate 18, blood pressure 151/63.  Weight is 156.  Head and neck:  Normocephalic, atraumatic skull.  There are no ocular or oral lesions.  There are no palpable cervical or supraclavicular lymph nodes. Lungs:  Clear bilaterally.  Cardiac:  Regular rate and rhythm with a normal S1, S2.  There are no murmurs, rubs or bruits.  Breasts:  Right breast with no masses, edema or erythema.  There is no right axillary adenopathy.  Left chest wall shows well-healed mastectomy.  No left chest wall nodules are noted.  There is no left axillary adenopathy. Back:  No tenderness of the spine, ribs, or hips.  Abdomen:  Soft, good bowel sounds.  There is no fluid wave.  There is no palpable hepatosplenomegaly.  Extremities:  No clubbing, cyanosis or edema. Skin:  Exam does show numerous seborrheic keratoses.  LABORATORY STUDIES:  White cell count 59, hemoglobin 13.8, hematocrit 42.1, platelet count 100.  White cell differential shows 4% segs, 95% lymphocytes.  IMPRESSION:  Ms. Riederer is a very nice 78 year old white female with stage A  chronic lymphocytic leukemia.  Still I am not sure that we could "up stage her."  Her platelet count does tend to fluctuate.  Her white count clearly is not going up, which is nice to see.  I still do not see an issue that we need to treat her for with respect to the CLL.  She has not had problems with infections.  She has not had bleeding. She has not had any palpable lymph glands.  Again, I told her to drink a lot of water while she is on vacation. Also told her 1 baby aspirin a day would be safe for her.  We will plan to get her back in 3 months.  We will see her back after Thanksgiving.  She does not need any lab work in between visits.    ______________________________ Josph Macho, M.D. PRE/MEDQ  D:  08/16/2012  T:  08/17/2012  Job:  [id number]

## 2012-11-22 ENCOUNTER — Other Ambulatory Visit (HOSPITAL_BASED_OUTPATIENT_CLINIC_OR_DEPARTMENT_OTHER): Payer: Medicare Other | Admitting: Lab

## 2012-11-22 ENCOUNTER — Other Ambulatory Visit: Payer: Self-pay | Admitting: Hematology & Oncology

## 2012-11-22 ENCOUNTER — Ambulatory Visit (HOSPITAL_BASED_OUTPATIENT_CLINIC_OR_DEPARTMENT_OTHER): Payer: Medicare Other | Admitting: Medical

## 2012-11-22 VITALS — BP 139/60 | HR 71 | Temp 97.4°F | Resp 16 | Wt 158.0 lb

## 2012-11-22 DIAGNOSIS — Z853 Personal history of malignant neoplasm of breast: Secondary | ICD-10-CM

## 2012-11-22 DIAGNOSIS — C9110 Chronic lymphocytic leukemia of B-cell type not having achieved remission: Secondary | ICD-10-CM | POA: Insufficient documentation

## 2012-11-22 DIAGNOSIS — C50919 Malignant neoplasm of unspecified site of unspecified female breast: Secondary | ICD-10-CM

## 2012-11-22 LAB — CBC WITH DIFFERENTIAL (CANCER CENTER ONLY)
BASO%: 0.1 % (ref 0.0–2.0)
LYMPH%: 92.4 % — ABNORMAL HIGH (ref 14.0–48.0)
MCH: 31.9 pg (ref 26.0–34.0)
MCV: 99 fL (ref 81–101)
MONO#: 0.9 10*3/uL (ref 0.1–0.9)
MONO%: 1.3 % (ref 0.0–13.0)
Platelets: 104 10*3/uL — ABNORMAL LOW (ref 145–400)
RDW: 14 % (ref 11.1–15.7)
WBC: 73 10*3/uL (ref 3.9–10.0)

## 2012-11-22 LAB — TECHNOLOGIST REVIEW CHCC SATELLITE

## 2012-11-22 NOTE — Progress Notes (Signed)
Diagnoses: #1 stage a chronic lymphocytic leukemia. #2 history of stage I infiltrating ductal carcinoma of the left breast.  Current therapy: Observation.  Interim history: Ms. presents today for an office followup visit.  Overall, she, reports, that she's doing quite well.  She did take a trip over to [place] over the summer.  She reports, that she had a miracle happened.  She reports, that her left arm lymphedema completely resolved and has not returned since then.  In terms of her CLL.  She has not had any, fevers.  She's not had any recurrent or new infections.  She does not report any night sweats, any palpable adenopathy.  She has a good appetite.  She denies any nausea, vomiting, diarrhea, constipation, any cough, chest pain, shortness of breath.  Any obvious, or abnormal bleeding.  She denies any headaches, visual changes, or rashes.  She does have a history of stage I infiltrating ductal carcinoma of the left breast.  She does have a left breast, mastectomy.  She reports, that she continues to get yearly mammograms of the right breast.  I do not have that recent imaging report.  Her white count today is 73.  He does tend to fluctuate.  She remains asymptomatic.  Review of Systems: Constitutional:Negative for malaise/fatigue, fever, chills, weight loss, diaphoresis, activity change, appetite change, and unexpected weight change.  HEENT: Negative for double vision, blurred vision, visual loss, ear pain, tinnitus, congestion, rhinorrhea, epistaxis sore throat or sinus disease, oral pain/lesion, tongue soreness Respiratory: Negative for cough, chest tightness, shortness of breath, wheezing and stridor.  Cardiovascular: Negative for chest pain, palpitations, leg swelling, orthopnea, PND, DOE or claudication Gastrointestinal: Negative for nausea, vomiting, abdominal pain, diarrhea, constipation, blood in stool, melena, hematochezia, abdominal distention, anal bleeding, rectal pain, anorexia and  hematemesis.  Genitourinary: Negative for dysuria, frequency, hematuria,  Musculoskeletal: Negative for myalgias, back pain, joint swelling, arthralgias and gait problem.  Skin: Negative for rash, color change, pallor and wound.  Neurological:. Negative for dizziness/light-headedness, tremors, seizures, syncope, facial asymmetry, speech difficulty, weakness, numbness, headaches and paresthesias.  Hematological: Negative for adenopathy. Does not bruise/bleed easily.  Psychiatric/Behavioral:  Negative for depression, no loss of interest in normal activity or change in sleep pattern.   Physical Exam: This is a pleasant, 78 year old, well-developed, well-nourished, white female, in no obvious distress Vitals: Temperature 97.4 degrees.  Pulse 16, respirations 71, blood pressure 139/60, weight 158 pounds HEENT reveals a normocephalic, atraumatic skull, no scleral icterus, no oral lesions  Neck is supple without any cervical or supraclavicular adenopathy.  Lungs are clear to auscultation bilaterally. There are no wheezes, rales or rhonci Cardiac is regular rate and rhythm with a normal S1 and S2. There are no murmurs, rubs, or bruits.  Abdomen is soft with good bowel sounds, there is no palpable mass. There is no palpable hepatosplenomegaly. There is no palpable fluid wave.  Musculoskeletal no tenderness of the spine, ribs, or hips.  Extremities there are no clubbing, cyanosis, or edema.  Skin no petechia, purpura or ecchymosis Neurologic is nonfocal. Breasts: The right breast is with no masses, edema, or erythema.  There is no right axillary adenopathy.  Left chest wall shows a well-healed mastectomy.  No left chest wall.  Nodules are noted.  There is no left axillary adenopathy.  Laboratory Data: White count 73, hemoglobin 12.9, hematocrit 39.9, platelets 104,000  Current Outpatient Prescriptions on File Prior to Visit  Medication Sig Dispense Refill  . estradiol (VAGIFEM) 25 MCG vaginal tablet  Place  25 mcg vaginally daily.      . Fish Oil-Cholecalciferol (FISH OIL + D3 PO) Take by mouth every morning.      Chilton Si Tea, Camillia sinensis, (GREEN TEA PO) Take by mouth every morning.      . MULTIPLE VITAMIN PO Take by mouth every morning. Multiple vitamin and mineral complex pack      . Probiotic Product (ALIGN PO) Take by mouth every morning.      . TOPROL XL 100 MG 24 hr tablet Take 100 mg by mouth Daily.       Assessment/Plan: This is a very pleasant, 78 year old, white female, with the following issues:  #1.  Stage a chronic lymphocytic leukemia.  For the time being, we will continue with observation.  Her white count does fluctuate.  She's not having any type of recurrent infections.  She's not having any fevers.  She's not having any palpable lymph glands.  She is not having any bleeding.  She is asymptomatic.  We will continue to follow her along.  Every 3 months.  #2.  History of stage I infiltrating ductal carcinoma of left breast.  She does have a left breast mastectomy.  She continues to receive yearly mammograms of the right breast.  #3.  Followup.  We will follow back up with Heather Knapp in 3 months, but before then should there be questions or concerns.

## 2013-02-18 ENCOUNTER — Telehealth: Payer: Self-pay | Admitting: Hematology & Oncology

## 2013-02-18 NOTE — Telephone Encounter (Signed)
Left message with new time on 3-28

## 2013-02-25 ENCOUNTER — Other Ambulatory Visit: Payer: Medicare Other | Admitting: Lab

## 2013-02-25 ENCOUNTER — Ambulatory Visit: Payer: Medicare Other | Admitting: Hematology & Oncology

## 2013-03-04 ENCOUNTER — Ambulatory Visit (HOSPITAL_BASED_OUTPATIENT_CLINIC_OR_DEPARTMENT_OTHER): Payer: Medicare Other | Admitting: Hematology & Oncology

## 2013-03-04 ENCOUNTER — Ambulatory Visit: Payer: Medicare Other | Admitting: Hematology & Oncology

## 2013-03-04 ENCOUNTER — Other Ambulatory Visit: Payer: Medicare Other | Admitting: Lab

## 2013-03-04 ENCOUNTER — Ambulatory Visit (HOSPITAL_BASED_OUTPATIENT_CLINIC_OR_DEPARTMENT_OTHER): Payer: Medicare Other | Admitting: Lab

## 2013-03-04 VITALS — BP 134/61 | HR 81 | Temp 98.8°F | Resp 16 | Ht 63.0 in | Wt 152.0 lb

## 2013-03-04 DIAGNOSIS — C9110 Chronic lymphocytic leukemia of B-cell type not having achieved remission: Secondary | ICD-10-CM

## 2013-03-04 DIAGNOSIS — Z853 Personal history of malignant neoplasm of breast: Secondary | ICD-10-CM

## 2013-03-04 LAB — COMPREHENSIVE METABOLIC PANEL
Albumin: 4.4 g/dL (ref 3.5–5.2)
Alkaline Phosphatase: 53 U/L (ref 39–117)
CO2: 35 mEq/L — ABNORMAL HIGH (ref 19–32)
Calcium: 9.4 mg/dL (ref 8.4–10.5)
Chloride: 101 mEq/L (ref 96–112)
Glucose, Bld: 88 mg/dL (ref 70–99)
Potassium: 4.3 mEq/L (ref 3.5–5.3)
Sodium: 138 mEq/L (ref 135–145)
Total Protein: 5.8 g/dL — ABNORMAL LOW (ref 6.0–8.3)

## 2013-03-04 LAB — CBC WITH DIFFERENTIAL (CANCER CENTER ONLY)
BASO%: 0.2 % (ref 0.0–2.0)
EOS%: 0.1 % (ref 0.0–7.0)
LYMPH#: 54.3 10*3/uL — ABNORMAL HIGH (ref 0.9–3.3)
MCHC: 33.2 g/dL (ref 32.0–36.0)
NEUT#: 2.7 10*3/uL (ref 1.5–6.5)
Platelets: 120 10*3/uL — ABNORMAL LOW (ref 145–400)
RDW: 14.1 % (ref 11.1–15.7)

## 2013-03-04 LAB — TECHNOLOGIST REVIEW CHCC SATELLITE

## 2013-03-04 NOTE — Progress Notes (Signed)
CC:   Brett H. Janey Greaser, MD  DIAGNOSES: 1. Stage A chronic lymphocytic leukemia. 2. History of stage I infiltrating ductal carcinoma of the left     breast.  CURRENT THERAPY:  Observation.  INTERIM HISTORY:  Ms. Kosa comes in for followup.  We last saw her back in December.  She got through the wintertime okay.  She feels okay.  She is working.  She is trying to exercise.  She is eating well.  She is having no problems with nausea or vomiting.  She is losing a little bit of weight, which she is happy about.  She has not noticed any palpable lymph glands.  There has been no leg swelling.  She has had no left arm swelling from her mastectomy.  PHYSICAL EXAMINATION:  General:  This is a petite white female in no obvious distress.  Vital signs:  Temperature of 98, pulse 81, respiratory rate 16, blood pressure 134/61.  Weight is 152.  Head/neck: Normocephalic, atraumatic skull.  There are no ocular or oral lesions. There are no palpable cervical or supraclavicular lymph nodes.  Lungs: Clear bilaterally.  Cardiac:  Regular rate and rhythm with a normal S1 and S2.  There are no murmurs, rubs, or bruits.  Abdomen:  Soft with good bowel sounds.  There is no palpable abdominal mass.  There is no fluid wave.  There is no palpable hepatosplenomegaly.  Breasts:  Right breast with no masses, edema or erythema.  There is no right axillary adenopathy.  The left chest wall shows a well-healed mastectomy.  No left chest wall nodules are noted.  There is no left axillary adenopathy.  Back:  No kyphosis.  There is no tenderness over the spine, ribs, or hips.  Extremities:  No clubbing, cyanosis or edema.  There may be some slight lymphedema in the left arm.  LABORATORY STUDIES:  White cell count is 57.9, hemoglobin 13.3, hematocrit 40.1, platelet count 120.  White cell differential shows 5% segs and 94% lymphs.  IMPRESSION:  Ms. Stanczyk is a 79 year old white female with chronic lymphocytic  leukemia.  She has stage A disease.  She also had a history of resected breast cancer of the left breast.  She had a mastectomy. This, I think, was over years ago.  Again, the chronic lymphocytic leukemia has not been a problem for [place]. Her platelet count actually is coming back up, which is nice to see. Her platelets do tend to fluctuate.  We will go ahead and plan to get her back in 4 months now.  I do not see that we need any blood work in between visits.    ______________________________ Josph Macho, M.D. PRE/MEDQ  D:  03/04/2013  T:  03/04/2013  Job:  [id number]

## 2013-03-04 NOTE — Progress Notes (Signed)
This office note has been dictated.

## 2013-04-12 ENCOUNTER — Telehealth: Payer: Self-pay | Admitting: *Deleted

## 2013-04-12 NOTE — Telephone Encounter (Signed)
Pt left message on the vociemail stating she wanted the results of her last 2 CMETs. She wishes to have them mailed to her home. Mailed to her as requested.

## 2013-07-01 ENCOUNTER — Ambulatory Visit (HOSPITAL_BASED_OUTPATIENT_CLINIC_OR_DEPARTMENT_OTHER): Payer: Medicare Other | Admitting: Hematology & Oncology

## 2013-07-01 ENCOUNTER — Other Ambulatory Visit (HOSPITAL_BASED_OUTPATIENT_CLINIC_OR_DEPARTMENT_OTHER): Payer: Medicare Other | Admitting: Lab

## 2013-07-01 VITALS — BP 141/65 | HR 65 | Temp 98.5°F | Resp 16 | Ht 63.0 in | Wt 149.0 lb

## 2013-07-01 DIAGNOSIS — C9110 Chronic lymphocytic leukemia of B-cell type not having achieved remission: Secondary | ICD-10-CM

## 2013-07-01 LAB — COMPREHENSIVE METABOLIC PANEL
ALT: 14 U/L (ref 0–35)
AST: 17 U/L (ref 0–37)
Calcium: 9.3 mg/dL (ref 8.4–10.5)
Chloride: 104 mEq/L (ref 96–112)
Creatinine, Ser: 0.78 mg/dL (ref 0.50–1.10)
Total Bilirubin: 0.7 mg/dL (ref 0.3–1.2)

## 2013-07-01 LAB — CBC WITH DIFFERENTIAL (CANCER CENTER ONLY)
BASO%: 0.4 % (ref 0.0–2.0)
LYMPH%: 91.8 % — ABNORMAL HIGH (ref 14.0–48.0)
MCV: 98 fL (ref 81–101)
MONO#: 0.9 10*3/uL (ref 0.1–0.9)
Platelets: 104 10*3/uL — ABNORMAL LOW (ref 145–400)
RDW: 13.9 % (ref 11.1–15.7)
WBC: 51.8 10*3/uL (ref 3.9–10.0)

## 2013-07-01 LAB — CHCC SATELLITE - SMEAR

## 2013-07-01 NOTE — Progress Notes (Signed)
This office note has been dictated.

## 2013-07-04 NOTE — Progress Notes (Signed)
CC:   Juluis Rainier, MD, Fax [phone]  DIAGNOSES: 1. Stage A CLL. 2. Stage I (T1 N0 M0) ductal carcinoma of the left breast.  CURRENT THERAPY:  Observation.  INTERIM HISTORY:  Ms. Mcguinness comes in for followup.  We see her every 4 months.  She has done well since we last saw her.  As always, she will be going on a cruise.  This will be in the fall.  She is going up to [place].  She feels okay.  She has had no specific complaints.  There is no nausea or vomiting.  There has been no fever, sweats, or chills.  She has had no problem with infections.  There has been no change in bowel or bladder habits.  She has not noticed any kind of rashes.  Overall, her performance status is ECOG 0.  PHYSICAL EXAM:  General:  This is a well-developed, well-nourished white female in no obvious distress.  Vital Signs:  Show a temperature of 98.5, pulse 65, respiratory rate 16, blood pressure 141/65.  Weight is 149.  Head and Neck:  Shows a normocephalic, atraumatic skull.  There are no ocular or oral lesions.  There are no palpable cervical or supraclavicular lymph nodes.  Lungs:  Clear bilaterally.  Cardiac: Regular rate and rhythm with a normal S1, S2.  There are no murmurs, rubs, or bruits.  Breasts:  Show the right breast with no masses, edema, or erythema.  There is no right axillary adenopathy.  Left chest wall shows well-healed mastectomy.  There are no left chest wall masses or nodularity.  There is no left axillary adenopathy.  Abdomen:  Soft.  She has good bowel sounds.  There is no fluid wave.  There is no palpable hepatosplenomegaly.  Back:  Shows some slight kyphosis.  There is no tenderness over the spine, ribs, or hips.  Extremities:  Show no clubbing, cyanosis, or edema.  Skin:  No rashes, ecchymosis, or petechia.  LABORATORY STUDIES:  White blood cell count of 51.8, hemoglobin 13.5, hematocrit 40.6, platelet count 104.  White cell differential shows 6% segs, 92  lymphocytes.  IMPRESSION:  Ms. Artz is a very nice 79 year old white female.  She certainly does not look her age.  We have been following her now for over, I think, 10 years for the chronic lymphocytic leukemia.  That has not been a problem for her.  She has been totally asymptomatic.  We will go ahead and plan to get her back in another 4 months for followup.  She does not need any lab work in between visits.  Of note, she did have a Life Line Screening evaluation recently. Everything looked good with respect to carotid arteries, peripheral arterial circulation.  She had no abdominal aortic aneurysm.  She had good bone density.    ______________________________ Josph Macho, M.D. PRE/MEDQ  D:  07/01/2013  T:  07/02/2013  Job:  [id number]

## 2013-07-05 ENCOUNTER — Encounter: Payer: Self-pay | Admitting: *Deleted

## 2013-10-28 ENCOUNTER — Other Ambulatory Visit (HOSPITAL_BASED_OUTPATIENT_CLINIC_OR_DEPARTMENT_OTHER): Payer: Medicare Other | Admitting: Lab

## 2013-10-28 ENCOUNTER — Ambulatory Visit (HOSPITAL_BASED_OUTPATIENT_CLINIC_OR_DEPARTMENT_OTHER): Payer: Medicare Other | Admitting: Hematology & Oncology

## 2013-10-28 VITALS — BP 147/53 | HR 65 | Temp 98.2°F | Resp 14 | Ht 63.0 in | Wt 148.0 lb

## 2013-10-28 DIAGNOSIS — C9110 Chronic lymphocytic leukemia of B-cell type not having achieved remission: Secondary | ICD-10-CM

## 2013-10-28 DIAGNOSIS — Z853 Personal history of malignant neoplasm of breast: Secondary | ICD-10-CM

## 2013-10-28 LAB — CBC WITH DIFFERENTIAL (CANCER CENTER ONLY)
HCT: 40.2 % (ref 34.8–46.6)
HGB: 13.1 g/dL (ref 11.6–15.9)
MCHC: 32.6 g/dL (ref 32.0–36.0)
MCV: 97 fL (ref 81–101)
RBC: 4.15 10*6/uL (ref 3.70–5.32)
RDW: 13.4 % (ref 11.1–15.7)

## 2013-10-28 LAB — CMP (CANCER CENTER ONLY)
AST: 19 U/L (ref 11–38)
Albumin: 3.8 g/dL (ref 3.3–5.5)
Alkaline Phosphatase: 46 U/L (ref 26–84)
BUN, Bld: 12 mg/dL (ref 7–22)
CO2: 31 mEq/L (ref 18–33)
Calcium: 9.1 mg/dL (ref 8.0–10.3)
Glucose, Bld: 109 mg/dL (ref 73–118)
Potassium: 4.3 mEq/L (ref 3.3–4.7)
Sodium: 140 mEq/L (ref 128–145)
Total Bilirubin: 0.9 mg/dl (ref 0.20–1.60)
Total Protein: 6.3 g/dL — ABNORMAL LOW (ref 6.4–8.1)

## 2013-10-28 LAB — MANUAL DIFFERENTIAL (CHCC SATELLITE)
ALC: 42.7 10*3/uL — ABNORMAL HIGH (ref 0.6–2.2)
ANC (CHCC HP manual diff): 3.2 10*3/uL (ref 1.5–6.7)
Platelet Morphology: NORMAL

## 2013-10-28 NOTE — Progress Notes (Signed)
This office note has been dictated.

## 2013-11-15 NOTE — Progress Notes (Signed)
CC:   Juluis Rainier, MD  DIAGNOSES: 1. Stage A chronic lymphocytic leukemia. 2. History of stage I (T1, N0, M0) ductal carcinoma of the left     breast.  CURRENT THERAPY:  Observation.  INTERIM HISTORY:  Heather Knapp comes in for followup.  We last saw her back in July.  She went on a cruise to [place].  She had a good time up there.  She has had no problems since we last saw her.  She is eating well.  She is still working.  She is trying to exercise.  She has had no problems with fever, sweats, or chills.  There has been no rashes.  There has been no change in bowel or bladder habits.  She has had no visual issues.  PHYSICAL EXAMINATION:  General:  This is a well-developed, well- nourished white female, in no obvious distress.  Vital Signs: Temperature 98.2, pulse 65, respiratory rate 14, blood pressure 147/53, and weight is 148 pounds.  Head and Neck:  Normocephalic, atraumatic skull.  There are no ocular or oral lesions.  There are no palpable cervical or supraclavicular lymph nodes.  Lungs:  Clear bilaterally. Cardiac:  Regular rate and rhythm with a normal S1, S2.  There are no murmurs, rubs, or bruits.  Abdomen:  Soft.  She has good bowel sounds. There is no fluid wave.  There is no palpable abdominal mass.  There is no palpable hepatosplenomegaly.  Breasts:  Right breast with no masses, edema, or erythema.  There is no right axillary adenopathy.  Left chest wall shows well-healed mastectomy.  There are no left chest wall nodules.  There is no left axillary lymph nodes.  Back:  No tenderness over the spine, ribs, or hips.  There is no kyphosis or osteoporotic changes.  Extremities:  No clubbing, cyanosis, or edema.  She has good range motion of her joints.  She has good muscle strength in upper and lower extremities.  Skin:  Numerous seborrheic keratoses.  LABORATORY STUDIES:  White cell count is 46, hemoglobin 13.1, hematocrit 40.2, and platelet count 103.  White cell  differential shows 4% segs and 93% lymphocytes.  Electrolytes show a calcium of 9.1, with an albumin of 3.8.  IMPRESSION:  [name] is a 79 year old white female with chronic lymphocytic leukemia.  This is what we are following right now.  Her disease actually has been holding quite steady.  We have been following her now for good I think 7 or 8 years, if not longer.  I do not see any problems with respect to her breast cancer.  I do not see any clinical evidence of recurrent disease.  We will go ahead and plan to get her back to see [place] in another 4 months.  I have reviewed her lab work with her.  I have reviewed the blood smear.    ______________________________ Josph Macho, M.D. PRE/MEDQ  D:  10/28/2013  T:  10/29/2013  Job:  [id number]

## 2014-02-24 ENCOUNTER — Other Ambulatory Visit (HOSPITAL_BASED_OUTPATIENT_CLINIC_OR_DEPARTMENT_OTHER): Payer: Medicare Other | Admitting: Lab

## 2014-02-24 ENCOUNTER — Ambulatory Visit (HOSPITAL_BASED_OUTPATIENT_CLINIC_OR_DEPARTMENT_OTHER): Payer: Medicare Other | Admitting: Hematology & Oncology

## 2014-02-24 VITALS — BP 143/55 | HR 69 | Temp 97.7°F | Resp 14 | Ht 63.0 in | Wt 145.0 lb

## 2014-02-24 DIAGNOSIS — C9110 Chronic lymphocytic leukemia of B-cell type not having achieved remission: Secondary | ICD-10-CM

## 2014-02-24 DIAGNOSIS — Z853 Personal history of malignant neoplasm of breast: Secondary | ICD-10-CM

## 2014-02-24 LAB — CBC WITH DIFFERENTIAL (CANCER CENTER ONLY)
HCT: 41.5 % (ref 34.8–46.6)
HGB: 14 g/dL (ref 11.6–15.9)
MCH: 32.3 pg (ref 26.0–34.0)
MCHC: 33.7 g/dL (ref 32.0–36.0)
MCV: 96 fL (ref 81–101)
PLATELETS: 117 10*3/uL — AB (ref 145–400)
RBC: 4.34 10*6/uL (ref 3.70–5.32)
RDW: 13.5 % (ref 11.1–15.7)
WBC: 46.8 10*3/uL — ABNORMAL HIGH (ref 3.9–10.0)

## 2014-02-24 LAB — MANUAL DIFFERENTIAL (CHCC SATELLITE)
ALC: 41.7 10*3/uL — ABNORMAL HIGH (ref 0.6–2.2)
ANC (CHCC MAN DIFF): 4.7 10*3/uL (ref 1.5–6.7)
Band Neutrophils: 1 % (ref 0–10)
LYMPH: 89 % — ABNORMAL HIGH (ref 14–48)
MONO: 1 % (ref 0–13)
PLATELET MORPHOLOGY: NORMAL
PLT EST ~~LOC~~: DECREASED
SEG: 9 % — AB (ref 40–75)

## 2014-02-24 LAB — COMPREHENSIVE METABOLIC PANEL
ALK PHOS: 51 U/L (ref 39–117)
ALT: 13 U/L (ref 0–35)
AST: 16 U/L (ref 0–37)
Albumin: 4.5 g/dL (ref 3.5–5.2)
BUN: 13 mg/dL (ref 6–23)
CO2: 24 meq/L (ref 19–32)
Calcium: 9 mg/dL (ref 8.4–10.5)
Chloride: 103 mEq/L (ref 96–112)
Creatinine, Ser: 0.61 mg/dL (ref 0.50–1.10)
Glucose, Bld: 98 mg/dL (ref 70–99)
Potassium: 4.3 mEq/L (ref 3.5–5.3)
SODIUM: 138 meq/L (ref 135–145)
TOTAL PROTEIN: 6 g/dL (ref 6.0–8.3)
Total Bilirubin: 0.6 mg/dL (ref 0.2–1.2)

## 2014-02-24 LAB — CHCC SATELLITE - SMEAR

## 2014-02-24 NOTE — Progress Notes (Signed)
Hematology and Oncology Follow Up Visit  Heather Knapp [id number] [birth year] 80 y.o. 02/24/2014   Principle Diagnosis:   Stage A chronic lymphocytic leukemia  Stage I (T1N0M0) infiltrating duct carcinoma the left breast   Current Therapy:    Observation     Interim History:  Ms.  Knapp is back for followup. We saw her November. She's doing quite well. She is about to go to [place] for another cruise. She'll be gone in about one or 2 weeks.  She's working. She's doing OK with work. She is having no problems with fatigue or weakness.  She's had no fever. She got the winter without any issues. She's had no nausea vomiting. She's had no change in bowel or bladder habits. There's been no rashes. She's had no cough. She's had no leg swelling.  There's been no change in her medications  Medications: Current outpatient prescriptions:Fish Oil-Cholecalciferol (FISH OIL + D3 PO), Take by mouth every morning., Disp: , Rfl: ;  Green Tea, Camillia sinensis, (GREEN TEA PO), Take by mouth every morning., Disp: , Rfl: ;  MULTIPLE VITAMIN PO, Take by mouth every morning. Multiple vitamin and mineral complex pack, Disp: , Rfl: ;  Probiotic Product (ALIGN PO), Take by mouth every morning., Disp: , Rfl:  TOPROL XL 100 MG 24 hr tablet, Take 100 mg by mouth Daily., Disp: , Rfl: ;  estradiol (VAGIFEM) 25 MCG vaginal tablet, Place 25 mcg vaginally as needed. , Disp: , Rfl:   Allergies: No Known Allergies  Past Medical History, Surgical history, Social history, and Family History were reviewed and updated.  Review of Systems:  As above  Physical Exam:  height is 5\' 3"  (1.6 m) and weight is 145 lb (65.772 kg). Her oral temperature is 97.7 F (36.5 C). Her blood pressure is 143/55 and her pulse is 69. Her respiration is 14.   Well-developed well-nourished white female in no obvious distress. Head and neck exam shows no ocular or oral lesions. She has no palpable cervical or supraclavicular lymph nodes.  Lungs are clear. Cardiac exam regular in rhythm with no murmurs rubs or bruits. Abdomen is soft. Has good bowel sounds. There is no palpable liver or spleen tip. Breast exam shows right breast no masses edema or erythema. There is no right axillary adenopathy. The chest wall is well-healed mastectomy there is no left chest wall nodules. There is no left axillary adenopathy. Back exam shows no kyphosis. No tenderness over the spine. Extremities shows no clubbing cyanosis or edema. She has good range of motion of her joints. Shows good strength. Skin exam shows numerous seborrheic keratoses. I see no suspicious lesions.  Lab Results  Component Value Date   WBC 46.8* 02/24/2014   HGB 14.0 02/24/2014   HCT 41.5 02/24/2014   MCV 96 02/24/2014   PLT 117* 02/24/2014     Chemistry      Component Value Date/Time   NA 140 10/28/2013 0949   NA 138 07/01/2013 1143   K 4.3 10/28/2013 0949   K 4.6 07/01/2013 1143   CL 98 10/28/2013 0949   CL 104 07/01/2013 1143   CO2 31 10/28/2013 0949   CO2 27 07/01/2013 1143   BUN 12 10/28/2013 0949   BUN 17 07/01/2013 1143   CREATININE 0.5* 10/28/2013 0949   CREATININE 0.78 07/01/2013 1143      Component Value Date/Time   CALCIUM 9.1 10/28/2013 0949   CALCIUM 9.3 07/01/2013 1143   ALKPHOS 46 10/28/2013 0949   ALKPHOS  53 07/01/2013 1143   AST 19 10/28/2013 0949   AST 17 07/01/2013 1143   ALT 18 10/28/2013 0949   ALT 14 07/01/2013 1143   BILITOT 0.90 10/28/2013 0949   BILITOT 0.7 07/01/2013 1143         Impression and Plan: Heather Knapp is  a 80 year old white female. She has CLL. We've been watching her for over 10 years. There is no evidence of progression.  Her breast cancer is not an issue. She now is out from her mastectomy of a by 14 or 15 years.  We will plan to get her back to see [place] now in 6 months.   Volanda Napoleon, MD 3/20/201511:34 AM

## 2014-08-12 ENCOUNTER — Encounter: Payer: Self-pay | Admitting: *Deleted

## 2014-08-12 DIAGNOSIS — I10 Essential (primary) hypertension: Secondary | ICD-10-CM | POA: Insufficient documentation

## 2014-08-25 ENCOUNTER — Other Ambulatory Visit (HOSPITAL_BASED_OUTPATIENT_CLINIC_OR_DEPARTMENT_OTHER): Payer: Medicare Other | Admitting: Lab

## 2014-08-25 ENCOUNTER — Encounter: Payer: Self-pay | Admitting: Hematology & Oncology

## 2014-08-25 ENCOUNTER — Ambulatory Visit (HOSPITAL_BASED_OUTPATIENT_CLINIC_OR_DEPARTMENT_OTHER): Payer: Medicare Other | Admitting: Hematology & Oncology

## 2014-08-25 VITALS — BP 148/52 | HR 65 | Temp 98.2°F | Resp 14 | Ht 63.0 in | Wt 133.0 lb

## 2014-08-25 DIAGNOSIS — C9110 Chronic lymphocytic leukemia of B-cell type not having achieved remission: Secondary | ICD-10-CM

## 2014-08-25 LAB — COMPREHENSIVE METABOLIC PANEL
ALT: 18 U/L (ref 0–35)
AST: 14 U/L (ref 0–37)
Albumin: 4.3 g/dL (ref 3.5–5.2)
Alkaline Phosphatase: 58 U/L (ref 39–117)
BILIRUBIN TOTAL: 0.7 mg/dL (ref 0.2–1.2)
BUN: 17 mg/dL (ref 6–23)
CALCIUM: 9.3 mg/dL (ref 8.4–10.5)
CO2: 27 meq/L (ref 19–32)
CREATININE: 0.65 mg/dL (ref 0.50–1.10)
Chloride: 103 mEq/L (ref 96–112)
Glucose, Bld: 74 mg/dL (ref 70–99)
Potassium: 4.3 mEq/L (ref 3.5–5.3)
Sodium: 140 mEq/L (ref 135–145)
Total Protein: 5.9 g/dL — ABNORMAL LOW (ref 6.0–8.3)

## 2014-08-25 LAB — TECHNOLOGIST REVIEW CHCC SATELLITE

## 2014-08-25 LAB — CBC WITH DIFFERENTIAL (CANCER CENTER ONLY)
BASO#: 0.1 10*3/uL (ref 0.0–0.2)
BASO%: 0.2 % (ref 0.0–2.0)
EOS ABS: 0.1 10*3/uL (ref 0.0–0.5)
EOS%: 0.2 % (ref 0.0–7.0)
HCT: 41.4 % (ref 34.8–46.6)
HEMOGLOBIN: 13.7 g/dL (ref 11.6–15.9)
LYMPH#: 28.4 10*3/uL — ABNORMAL HIGH (ref 0.9–3.3)
LYMPH%: 92.4 % — ABNORMAL HIGH (ref 14.0–48.0)
MCH: 32.1 pg (ref 26.0–34.0)
MCHC: 33.1 g/dL (ref 32.0–36.0)
MCV: 97 fL (ref 81–101)
MONO#: 0.4 10*3/uL (ref 0.1–0.9)
MONO%: 1.4 % (ref 0.0–13.0)
NEUT%: 5.8 % — ABNORMAL LOW (ref 39.6–80.0)
NEUTROS ABS: 1.8 10*3/uL (ref 1.5–6.5)
Platelets: 102 10*3/uL — ABNORMAL LOW (ref 145–400)
RBC: 4.27 10*6/uL (ref 3.70–5.32)
RDW: 13.9 % (ref 11.1–15.7)
WBC: 30.7 10*3/uL — ABNORMAL HIGH (ref 3.9–10.0)

## 2014-08-26 LAB — IGG, IGA, IGM
IgA: 21 mg/dL — ABNORMAL LOW (ref 69–380)
IgG (Immunoglobin G), Serum: 367 mg/dL — ABNORMAL LOW (ref 690–1700)
IgM, Serum: 6 mg/dL — ABNORMAL LOW (ref 52–322)

## 2014-08-26 LAB — LACTATE DEHYDROGENASE: LDH: 150 U/L (ref 94–250)

## 2014-08-26 NOTE — Progress Notes (Signed)
Hematology and Oncology Follow Up Visit  Heather Knapp [id number] Nov 12, 1934 80 y.o. 08/26/2014   Principle Diagnosis:   Stage A chronic lymphocytic leukemia  Stage I (T1N0M0) infiltrating duct carcinoma the left breast   Current Therapy:    Observation     Interim History:  Ms.  Knapp is back for followup. Lungs are back in March. She's been doing pretty well. She did go on a cruise. She already go on another one. She be on down to [place].   She  had her birthday last week. She had a good time. She is still working. She's not too happy with having to work.  She's had no problems with infections. She's had no palpable lymph glands. She's had no nausea vomiting. There's been no change in bowel or bladder habits. She's had no rashes. There's been no leg swelling.  Overall, her performance status is ECOG 1. .  Medications: Current outpatient prescriptions:Fish Oil-Cholecalciferol (FISH OIL + D3 PO), Take by mouth every morning., Disp: , Rfl: ;  Green Tea, Camillia sinensis, (GREEN TEA PO), Take by mouth every morning., Disp: , Rfl: ;  Melatonin 3 MG TABS, Take by mouth daily after supper., Disp: , Rfl: ;  MULTIPLE VITAMIN PO, Take by mouth every morning. Multiple vitamin and mineral complex pack, Disp: , Rfl:  NON FORMULARY, Take by mouth 2 (two) times daily. BONE SUPPORT, Disp: , Rfl: ;  Probiotic Product (ALIGN PO), Take by mouth every evening. , Disp: , Rfl: ;  TOPROL XL 100 MG 24 hr tablet, Take 100 mg by mouth Daily., Disp: , Rfl:   Allergies: No Known Allergies  Past Medical History, Surgical history, Social history, and Family History were reviewed and updated.  Review of Systems: As above  Physical Exam:  height is 5\' 3"  (1.6 m) and weight is 133 lb (60.328 kg). Her oral temperature is 98.2 F (36.8 C). Her blood pressure is 148/52 and her pulse is 65. Her respiration is 14.   Petite white female in no obvious distress. Head and exam shows no ocular or oral lesions.  There are no palpable cervical or supraclavicular lymph nodes. Lungs are clear. Cardiac exam regular in rhythm with no murmurs, rubs or bruits. Abdomen is soft. She is good bowel sounds. There is no fluid wave. There is no palpable liver or spleen tip. Breast exam shows right breast with no masses, erythema or edema. Shows no right axillary adenopathy. Left chest wall shows well-healed mastectomy. The left foot Modlin noted. There is no left axillary adenopathy. Back exam shows no kyphosis. There is no tenderness over the spine. Extremities shows no clubbing, cyanosis or edema. Skin exam no rashes, ecchymosis or petechia. Neurological exam is non-focal.  Lab Results  Component Value Date   WBC 30.7* 08/25/2014   HGB 13.7 08/25/2014   HCT 41.4 08/25/2014   MCV 97 08/25/2014   PLT 102* 08/25/2014     Chemistry      Component Value Date/Time   NA 140 08/25/2014 1000   NA 140 10/28/2013 0949   K 4.3 08/25/2014 1000   K 4.3 10/28/2013 0949   CL 103 08/25/2014 1000   CL 98 10/28/2013 0949   CO2 27 08/25/2014 1000   CO2 31 10/28/2013 0949   BUN 17 08/25/2014 1000   BUN 12 10/28/2013 0949   CREATININE 0.65 08/25/2014 1000   CREATININE 0.5* 10/28/2013 0949      Component Value Date/Time   CALCIUM 9.3 08/25/2014 1000  CALCIUM 9.1 10/28/2013 0949   ALKPHOS 58 08/25/2014 1000   ALKPHOS 46 10/28/2013 0949   AST 14 08/25/2014 1000   AST 19 10/28/2013 0949   ALT 18 08/25/2014 1000   ALT 18 10/28/2013 0949   BILITOT 0.7 08/25/2014 1000   BILITOT 0.90 10/28/2013 0949         Impression and Plan: Heather Knapp is 80 year old female. She has CLL. Her white cell count continues to improve.  I don't see any problems with her CLL. There certainly is no reason for [place] to treat her.  Her breast cancer is really not an issue. She's had no problems with respect to her breast cancer now for about 15 years.  We will plan to get her back to see [place] in 6 more months. I know that she will have a good time on her  cruise.   Volanda Napoleon, MD 9/19/20158:18 AM

## 2014-08-29 ENCOUNTER — Telehealth: Payer: Self-pay | Admitting: *Deleted

## 2014-08-29 NOTE — Telephone Encounter (Addendum)
Message copied by Lenn Sink on Tue Aug 29, 2014  9:36 AM ------      Message from: Volanda Napoleon      Created: Fri Aug 25, 2014  6:16 PM       CALL -- LABS ARE OK!!  PLEASE MAIL HER A COPY!!!!  PETE ------Informed pt that labs looked good and our office is sending her a copy.

## 2015-02-23 ENCOUNTER — Other Ambulatory Visit (HOSPITAL_BASED_OUTPATIENT_CLINIC_OR_DEPARTMENT_OTHER): Payer: Medicare Other | Admitting: Lab

## 2015-02-23 ENCOUNTER — Ambulatory Visit (HOSPITAL_BASED_OUTPATIENT_CLINIC_OR_DEPARTMENT_OTHER): Payer: Medicare Other | Admitting: Hematology & Oncology

## 2015-02-23 ENCOUNTER — Encounter: Payer: Self-pay | Admitting: Hematology & Oncology

## 2015-02-23 VITALS — BP 139/59 | HR 72 | Temp 97.6°F | Resp 14 | Ht 63.0 in | Wt 129.0 lb

## 2015-02-23 DIAGNOSIS — C9110 Chronic lymphocytic leukemia of B-cell type not having achieved remission: Secondary | ICD-10-CM

## 2015-02-23 DIAGNOSIS — Z853 Personal history of malignant neoplasm of breast: Secondary | ICD-10-CM

## 2015-02-23 LAB — MANUAL DIFFERENTIAL (CHCC SATELLITE)
ALC: 36.8 10*3/uL — ABNORMAL HIGH (ref 0.6–2.2)
ANC (CHCC MAN DIFF): 0.8 10*3/uL — AB (ref 1.5–6.7)
LYMPH: 98 % — ABNORMAL HIGH (ref 14–48)
PLT EST ~~LOC~~: DECREASED
RBC Comments: NORMAL
SEG: 2 % — AB (ref 40–75)

## 2015-02-23 LAB — COMPREHENSIVE METABOLIC PANEL (CC13)
ALT: 19 U/L (ref 0–55)
AST: 31 U/L (ref 5–34)
Albumin: 4.1 g/dL (ref 3.5–5.0)
Alkaline Phosphatase: 57 U/L (ref 40–150)
Anion Gap: 10 mEq/L (ref 3–11)
BILIRUBIN TOTAL: 0.65 mg/dL (ref 0.20–1.20)
BUN: 19.9 mg/dL (ref 7.0–26.0)
CALCIUM: 9.3 mg/dL (ref 8.4–10.4)
CHLORIDE: 103 meq/L (ref 98–109)
CO2: 26 meq/L (ref 22–29)
Creatinine: 0.7 mg/dL (ref 0.6–1.1)
EGFR: 80 mL/min/{1.73_m2} — ABNORMAL LOW (ref >90)
Glucose: 83 mg/dl (ref 70–140)
Potassium: 4.9 mEq/L (ref 3.5–5.1)
SODIUM: 139 meq/L (ref 136–145)
TOTAL PROTEIN: 6.1 g/dL — AB (ref 6.4–8.3)

## 2015-02-23 LAB — CBC WITH DIFFERENTIAL (CANCER CENTER ONLY)
HCT: 40.1 % (ref 34.8–46.6)
HGB: 13.5 g/dL (ref 11.6–15.9)
MCH: 32.3 pg (ref 26.0–34.0)
MCHC: 33.7 g/dL (ref 32.0–36.0)
MCV: 96 fL (ref 81–101)
PLATELETS: 101 10*3/uL — AB (ref 145–400)
RBC: 4.18 10*6/uL (ref 3.70–5.32)
RDW: 13.6 % (ref 11.1–15.7)
WBC: 37.6 10*3/uL — ABNORMAL HIGH (ref 3.9–10.0)

## 2015-02-23 LAB — CHCC SATELLITE - SMEAR

## 2015-02-23 NOTE — Progress Notes (Signed)
Hematology and Oncology Follow Up Visit  Heather Knapp [id number] 12-02-1934 81 y.o. 02/23/2015   Principle Diagnosis:   Stage A chronic lymphocytic leukemia  Stage I (T1N0M0) infiltrating duct carcinoma the left breast   Current Therapy:    Observation     Interim History:  Ms.  Heather Knapp is back for followup. We last saw her in September. We see her every 6 months. Pap she is working. She is busy at work.  She will be going on another cruise in May. She always goes on a cruise once or twice a year.  She's had no problems over the wintertime. She's had no fever. She's had no sweats. She's had no lymphadenopathy. There's been no change in bowel or bladder habits.  She's tried exercise. She is watching what she eats. Overall, her performance status is ECOG 1. .  Medications:  Current outpatient prescriptions:  .  Fish Oil-Cholecalciferol (FISH OIL + D3 PO), Take by mouth every morning., Disp: , Rfl:  .  Green Tea, Camillia sinensis, (GREEN TEA PO), Take by mouth every morning., Disp: , Rfl:  .  Melatonin 3 MG TABS, Take by mouth daily after supper., Disp: , Rfl:  .  MULTIPLE VITAMIN PO, Take by mouth every morning. Multiple vitamin and mineral complex pack, Disp: , Rfl:  .  NON FORMULARY, Take by mouth 2 (two) times daily. BONE SUPPORT, Disp: , Rfl:  .  Probiotic Product (ALIGN PO), Take by mouth every evening. , Disp: , Rfl:  .  TOPROL XL 100 MG 24 hr tablet, Take 100 mg by mouth Daily., Disp: , Rfl:   Allergies: No Known Allergies  Past Medical History, Surgical history, Social history, and Family History were reviewed and updated.  Review of Systems: As above  Physical Exam:  height is 5\' 3"  (1.6 m) and weight is 129 lb (58.514 kg). Her oral temperature is 97.6 F (36.4 C). Her blood pressure is 139/59 and her pulse is 72. Her respiration is 14.   Petite white female in no obvious distress. Head and exam shows no ocular or oral lesions. There are no palpable cervical or  supraclavicular lymph nodes. Lungs are clear. Cardiac exam regular and with no murmurs, rubs or bruits. Abdomen is soft. She has good bowel sounds. There is no fluid wave. There is no palpable liver or spleen tip. Breast exam shows right breast with no masses, erythema or edema. Shows no right axillary adenopathy. Left chest wall shows well-healed mastectomy. The mastectomy site is without erythema, nodularity or warmth. There is no left axillary adenopathy. Back exam shows no kyphosis. There is no tenderness over the spine. Extremities shows no clubbing, cyanosis or edema. Skin exam no rashes, ecchymosis or petechia. Neurological exam is non-focal.  Lab Results  Component Value Date   WBC 37.6* 02/23/2015   HGB 13.5 02/23/2015   HCT 40.1 02/23/2015   MCV 96 02/23/2015   PLT 101* 02/23/2015     Chemistry      Component Value Date/Time   NA 140 08/25/2014 1000   NA 140 10/28/2013 0949   K 4.3 08/25/2014 1000   K 4.3 10/28/2013 0949   CL 103 08/25/2014 1000   CL 98 10/28/2013 0949   CO2 27 08/25/2014 1000   CO2 31 10/28/2013 0949   BUN 17 08/25/2014 1000   BUN 12 10/28/2013 0949   CREATININE 0.65 08/25/2014 1000   CREATININE 0.5* 10/28/2013 0949      Component Value Date/Time   CALCIUM  9.3 08/25/2014 1000   CALCIUM 9.1 10/28/2013 0949   ALKPHOS 58 08/25/2014 1000   ALKPHOS 46 10/28/2013 0949   AST 14 08/25/2014 1000   AST 19 10/28/2013 0949   ALT 18 08/25/2014 1000   ALT 18 10/28/2013 0949   BILITOT 0.7 08/25/2014 1000   BILITOT 0.90 10/28/2013 0949         Impression and Plan: Ms. [name] is 81 year old female. She has CLL. Her white cell count continues to improve.  I don't see any problems with her CLL. There certainly is no reason for [place] to treat her.  Her breast cancer is really not an issue. She's had no problems with respect to her breast cancer now for  15 years.  We will plan to get her back to see [place] in 6 more months. I know that she will have a good time on  her cruise.   Volanda Napoleon, MD 3/18/201610:25 AM

## 2015-07-13 ENCOUNTER — Encounter (HOSPITAL_COMMUNITY): Payer: Self-pay | Admitting: *Deleted

## 2015-07-13 ENCOUNTER — Emergency Department (HOSPITAL_COMMUNITY): Payer: No Typology Code available for payment source

## 2015-07-13 ENCOUNTER — Inpatient Hospital Stay (HOSPITAL_COMMUNITY)
Admission: EM | Admit: 2015-07-13 | Discharge: 2015-07-16 | DRG: 536 | Disposition: A | Discharge status: Skilled Nursing Facility | Payer: No Typology Code available for payment source | Attending: Internal Medicine | Admitting: Internal Medicine

## 2015-07-13 DIAGNOSIS — D696 Thrombocytopenia, unspecified: Secondary | ICD-10-CM | POA: Insufficient documentation

## 2015-07-13 DIAGNOSIS — S329XXA Fracture of unspecified parts of lumbosacral spine and pelvis, initial encounter for closed fracture: Secondary | ICD-10-CM | POA: Diagnosis present

## 2015-07-13 DIAGNOSIS — C9110 Chronic lymphocytic leukemia of B-cell type not having achieved remission: Secondary | ICD-10-CM | POA: Insufficient documentation

## 2015-07-13 DIAGNOSIS — E785 Hyperlipidemia, unspecified: Secondary | ICD-10-CM | POA: Diagnosis not present

## 2015-07-13 DIAGNOSIS — E875 Hyperkalemia: Secondary | ICD-10-CM | POA: Diagnosis not present

## 2015-07-13 DIAGNOSIS — R7401 Elevation of levels of liver transaminase levels: Secondary | ICD-10-CM

## 2015-07-13 DIAGNOSIS — I10 Essential (primary) hypertension: Secondary | ICD-10-CM | POA: Diagnosis present

## 2015-07-13 DIAGNOSIS — R740 Nonspecific elevation of levels of transaminase and lactic acid dehydrogenase [LDH]: Secondary | ICD-10-CM | POA: Diagnosis present

## 2015-07-13 DIAGNOSIS — Z79899 Other long term (current) drug therapy: Secondary | ICD-10-CM | POA: Diagnosis not present

## 2015-07-13 DIAGNOSIS — M545 Low back pain: Secondary | ICD-10-CM | POA: Diagnosis not present

## 2015-07-13 DIAGNOSIS — S32592A Other specified fracture of left pubis, initial encounter for closed fracture: Secondary | ICD-10-CM | POA: Diagnosis not present

## 2015-07-13 DIAGNOSIS — S3210XA Unspecified fracture of sacrum, initial encounter for closed fracture: Secondary | ICD-10-CM | POA: Diagnosis present

## 2015-07-13 DIAGNOSIS — R52 Pain, unspecified: Secondary | ICD-10-CM

## 2015-07-13 DIAGNOSIS — R531 Weakness: Secondary | ICD-10-CM

## 2015-07-13 DIAGNOSIS — Y92410 Unspecified street and highway as the place of occurrence of the external cause: Secondary | ICD-10-CM

## 2015-07-13 LAB — CBC WITH DIFFERENTIAL/PLATELET
BASOS PCT: 0 % (ref 0–1)
Basophils Absolute: 0 10*3/uL (ref 0.0–0.1)
EOS PCT: 0 % (ref 0–5)
Eosinophils Absolute: 0 10*3/uL (ref 0.0–0.7)
HCT: 40.4 % (ref 36.0–46.0)
HEMOGLOBIN: 14 g/dL (ref 12.0–15.0)
Lymphocytes Relative: 79 % — ABNORMAL HIGH (ref 12–46)
Lymphs Abs: 30.2 10*3/uL — ABNORMAL HIGH (ref 0.7–4.0)
MCH: 33.1 pg (ref 26.0–34.0)
MCHC: 34.7 g/dL (ref 30.0–36.0)
MCV: 95.5 fL (ref 78.0–100.0)
Monocytes Absolute: 0.4 10*3/uL (ref 0.1–1.0)
Monocytes Relative: 1 % — ABNORMAL LOW (ref 3–12)
NEUTROS ABS: 7.6 10*3/uL (ref 1.7–7.7)
Neutrophils Relative %: 20 % — ABNORMAL LOW (ref 43–77)
Platelets: 107 10*3/uL — ABNORMAL LOW (ref 150–400)
RBC: 4.23 MIL/uL (ref 3.87–5.11)
RDW: 13.3 % (ref 11.5–15.5)
WBC: 38.2 10*3/uL — ABNORMAL HIGH (ref 4.0–10.5)

## 2015-07-13 LAB — COMPREHENSIVE METABOLIC PANEL
ALK PHOS: 55 U/L (ref 38–126)
ALT: 31 U/L (ref 14–54)
AST: 63 U/L — AB (ref 15–41)
Albumin: 4.2 g/dL (ref 3.5–5.0)
Anion gap: 11 (ref 5–15)
BUN: 14 mg/dL (ref 6–20)
CO2: 21 mmol/L — ABNORMAL LOW (ref 22–32)
Calcium: 9 mg/dL (ref 8.9–10.3)
Chloride: 104 mmol/L (ref 101–111)
Creatinine, Ser: 0.62 mg/dL (ref 0.44–1.00)
GFR calc Af Amer: >60 mL/min (ref >60)
GFR calc non Af Amer: >60 mL/min (ref >60)
Glucose, Bld: 94 mg/dL (ref 65–99)
Potassium: 5.3 mmol/L — ABNORMAL HIGH (ref 3.5–5.1)
Sodium: 136 mmol/L (ref 135–145)
TOTAL PROTEIN: 6.3 g/dL — AB (ref 6.5–8.1)
Total Bilirubin: 1 mg/dL (ref 0.3–1.2)

## 2015-07-13 MED ORDER — IOHEXOL 300 MG/ML  SOLN
100.0000 mL | Freq: Once | INTRAMUSCULAR | Status: AC | PRN
  Administered 2015-07-13: 100 mL via INTRAVENOUS

## 2015-07-13 MED ORDER — OXYCODONE-ACETAMINOPHEN 5-325 MG PO TABS
1.0000 | ORAL_TABLET | ORAL | Status: DC | PRN
  Administered 2015-07-15 (×2): 1 via ORAL
  Filled 2015-07-13 (×2): qty 1

## 2015-07-13 MED ORDER — ONDANSETRON HCL 4 MG PO TABS
4.0000 mg | ORAL_TABLET | Freq: Four times a day (QID) | ORAL | Status: DC | PRN
Start: 2015-07-13 — End: 2015-07-16

## 2015-07-13 MED ORDER — VITAMIN D3 25 MCG (1000 UNIT) PO TABS
1000.0000 [IU] | ORAL_TABLET | Freq: Every day | ORAL | Status: DC
  Administered 2015-07-14 – 2015-07-16 (×3): 1000 [IU] via ORAL
  Filled 2015-07-13 (×3): qty 1

## 2015-07-13 MED ORDER — ALUM & MAG HYDROXIDE-SIMETH 200-200-20 MG/5ML PO SUSP: 30.0000 mL | Freq: Four times a day (QID) | ORAL | Status: DC | PRN

## 2015-07-13 MED ORDER — RISAQUAD PO CAPS
1.0000 | ORAL_CAPSULE | Freq: Every day | ORAL | Status: DC
  Administered 2015-07-14 – 2015-07-16 (×3): 1 via ORAL
  Filled 2015-07-13 (×3): qty 1

## 2015-07-13 MED ORDER — SODIUM POLYSTYRENE SULFONATE 15 GM/60ML PO SUSP
15.0000 g | Freq: Once | ORAL | Status: AC
  Administered 2015-07-13: 15 g via ORAL
  Filled 2015-07-13: qty 60

## 2015-07-13 MED ORDER — SODIUM CHLORIDE 0.9 % IV SOLN
INTRAVENOUS | Status: AC
  Administered 2015-07-13: 23:00:00 via INTRAVENOUS

## 2015-07-13 MED ORDER — ONDANSETRON HCL 4 MG/2ML IJ SOLN: 4.0000 mg | Freq: Four times a day (QID) | INTRAMUSCULAR | Status: DC | PRN

## 2015-07-13 MED ORDER — HYDROMORPHONE HCL 1 MG/ML IJ SOLN
0.5000 mg | INTRAMUSCULAR | Status: DC | PRN
  Administered 2015-07-14: 0.5 mg via INTRAVENOUS
  Filled 2015-07-13: qty 1

## 2015-07-13 MED ORDER — OXYCODONE HCL 5 MG PO TABS
5.0000 mg | ORAL_TABLET | ORAL | Status: DC | PRN
  Administered 2015-07-15: 5 mg via ORAL
  Filled 2015-07-13: qty 1

## 2015-07-13 MED ORDER — ACETAMINOPHEN 650 MG RE SUPP
650.0000 mg | Freq: Four times a day (QID) | RECTAL | Status: DC | PRN
Start: 2015-07-13 — End: 2015-07-16

## 2015-07-13 MED ORDER — ACETAMINOPHEN 325 MG PO TABS: 650.0000 mg | ORAL_TABLET | Freq: Four times a day (QID) | ORAL | Status: DC | PRN

## 2015-07-13 MED ORDER — METOPROLOL SUCCINATE ER 100 MG PO TB24
100.0000 mg | ORAL_TABLET | Freq: Every day | ORAL | Status: DC
  Administered 2015-07-14 – 2015-07-15 (×2): 100 mg via ORAL
  Filled 2015-07-13 (×3): qty 1
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
## 2015-07-13 NOTE — ED Notes (Signed)
Pt unable to ambulate--- able to stand with staff assistance but unable to walk; pt states, "I can't, it hurts".
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
## 2015-07-13 NOTE — ED Notes (Signed)
RN Chere to start IV

## 2015-07-14 ENCOUNTER — Inpatient Hospital Stay (HOSPITAL_COMMUNITY): Payer: No Typology Code available for payment source

## 2015-07-14 DIAGNOSIS — C9110 Chronic lymphocytic leukemia of B-cell type not having achieved remission: Secondary | ICD-10-CM

## 2015-07-14 DIAGNOSIS — D696 Thrombocytopenia, unspecified: Secondary | ICD-10-CM

## 2015-07-14 DIAGNOSIS — S329XXA Fracture of unspecified parts of lumbosacral spine and pelvis, initial encounter for closed fracture: Secondary | ICD-10-CM

## 2015-07-14 DIAGNOSIS — E785 Hyperlipidemia, unspecified: Secondary | ICD-10-CM

## 2015-07-14 LAB — BASIC METABOLIC PANEL
ANION GAP: 6 (ref 5–15)
BUN: 18 mg/dL (ref 6–20)
CO2: 26 mmol/L (ref 22–32)
CREATININE: 0.63 mg/dL (ref 0.44–1.00)
Calcium: 8 mg/dL — ABNORMAL LOW (ref 8.9–10.3)
Chloride: 105 mmol/L (ref 101–111)
GFR calc Af Amer: >60 mL/min (ref >60)
Glucose, Bld: 118 mg/dL — ABNORMAL HIGH (ref 65–99)
Potassium: 3.6 mmol/L (ref 3.5–5.1)
SODIUM: 137 mmol/L (ref 135–145)

## 2015-07-14 LAB — CBC
HCT: 31 % — ABNORMAL LOW (ref 36.0–46.0)
HEMOGLOBIN: 10.3 g/dL — AB (ref 12.0–15.0)
MCH: 31.6 pg (ref 26.0–34.0)
MCHC: 33.2 g/dL (ref 30.0–36.0)
MCV: 95.1 fL (ref 78.0–100.0)
Platelets: 83 10*3/uL — ABNORMAL LOW (ref 150–400)
RBC: 3.26 MIL/uL — ABNORMAL LOW (ref 3.87–5.11)
RDW: 13.4 % (ref 11.5–15.5)
WBC: 23.6 10*3/uL — AB (ref 4.0–10.5)

## 2015-07-14 LAB — URINE MICROSCOPIC-ADD ON

## 2015-07-14 LAB — URINALYSIS, ROUTINE W REFLEX MICROSCOPIC
Bilirubin Urine: NEGATIVE
Glucose, UA: NEGATIVE mg/dL
KETONES UR: NEGATIVE mg/dL
Nitrite: NEGATIVE
Protein, ur: NEGATIVE mg/dL
Specific Gravity, Urine: 1.014 (ref 1.005–1.030)
UROBILINOGEN UA: 1 mg/dL (ref 0.0–1.0)
pH: 6.5 (ref 5.0–8.0)
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
## 2015-07-15 MED ORDER — OXYCODONE HCL 5 MG PO TABS
5.0000 mg | ORAL_TABLET | ORAL | Status: DC | PRN
  Administered 2015-07-15: 10 mg via ORAL
  Filled 2015-07-15: qty 2
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
## 2015-07-16 DIAGNOSIS — E875 Hyperkalemia: Secondary | ICD-10-CM

## 2015-07-16 LAB — URINE CULTURE

## 2015-07-16 MED ORDER — OXYCODONE HCL 5 MG PO TABS: 5.0000 mg | ORAL_TABLET | ORAL | Status: DC | PRN

## 2015-07-16 MED ORDER — METOPROLOL SUCCINATE ER 50 MG PO TB24
50.0000 mg | ORAL_TABLET | Freq: Every day | ORAL | Status: DC
  Administered 2015-07-16: 50 mg via ORAL
  Filled 2015-07-16: qty 1

## 2015-07-16 NOTE — Discharge Instructions (Signed)
Follow with Primary MD Gerrit Heck, MD after discharge from SNF.  Get CBC, CMP, 2 view Chest X ray checked  by Primary MD next visit.    Activity: As per PT/OT  recommendation   Disposition SNF   Diet: Heart Healthy  , with feeding assistance and aspiration precautions.  For Heart failure patients - Check your Weight same time everyday, if you gain over 2 pounds, or you develop in leg swelling, experience more shortness of breath or chest pain, call your Primary MD immediately. Follow Cardiac Low Salt Diet and 1.5 lit/day fluid restriction.   On your next visit with your primary care physician please Get Medicines reviewed and adjusted.   Please request your Prim.MD to go over all Hospital Tests and Procedure/Radiological results at the follow up, please get all Hospital records sent to your Prim MD by signing hospital release before you go home.   If you experience worsening of your admission symptoms, develop shortness of breath, life threatening emergency, suicidal or homicidal thoughts you must seek medical attention immediately by calling 911 or calling your MD immediately  if symptoms less severe.  You Must read complete instructions/literature along with all the possible adverse reactions/side effects for all the Medicines you take and that have been prescribed to you. Take any new Medicines after you have completely understood and accpet all the possible adverse reactions/side effects.   Do not drive, operating heavy machinery, perform activities at heights, swimming or participation in water activities or provide baby sitting services if your were admitted for syncope or siezures until you have seen by Primary MD or a Neurologist and advised to do so again.  Do not drive when taking Pain medications.    Do not take more than prescribed Pain, Sleep and Anxiety Medications  Special Instructions: If you have smoked or chewed Tobacco  in the last 2 yrs please stop  smoking, stop any regular Alcohol  and or any Recreational drug use.  Wear Seat belts while driving.   Please note  You were cared for by a hospitalist during your hospital stay. If you have any questions about your discharge medications or the care you received while you were in the hospital after you are discharged, you can call the unit and asked to speak with the hospitalist on call if the hospitalist that took care of you is not available. Once you are discharged, your primary care physician will handle any further medical issues. Please note that NO REFILLS for any discharge medications will be authorized once you are discharged, as it is imperative that you return to your primary care physician (or establish a relationship with a primary care physician if you do not have one) for your aftercare needs so that they can reassess your need for medications and monitor your lab values.
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
## 2015-07-16 NOTE — Progress Notes (Signed)
Clinical Social Work  Patient and family chose [facility] who is agreeable to accept patient today. Dtr to go to [employer] to complete paperwork. CSW prepared DC packet with FL2, DC summary and hard scripts included. Patient to have [facility] transport her. RN to call report.  [facility] arranged: request [phone].  CSW is signing off but available if needed.  [place], [place] [phone]
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
## 2015-07-16 NOTE — Progress Notes (Signed)
Clinical Social Work  CSW met with patient three times over the course of the day to discuss DC plans. CSW provided SNF list with bed offers. Patient reports she would probably prefer placement [facility] because it is close to her home. Patient reports she is waiting for dtr to arrive before making a final decision. CSW explained DC orders were placed so DC would need to occur today. CSW will follow up in about an hour for final decision.  [place], [place] [phone]

## 2015-07-16 NOTE — Progress Notes (Signed)
Report called to [facility] unit.

## 2015-07-16 NOTE — Progress Notes (Signed)
Clinical Social Work  Per MD, patient should DC today. CSW met with patient to provide bed offers. Patient reports she will most likely choose [facility] but wants to discuss plans with dtr first. CSW will continue to follow and will assist with DC once summary completed.  [place], [place] [phone]

## 2015-07-16 NOTE — Clinical Social Work Placement (Signed)
   CLINICAL SOCIAL WORK PLACEMENT  NOTE  Date:  07/16/2015  Patient Details  Name: Heather Knapp MRN: [id number] Date of Birth: [birth year]  Clinical Social Work is seeking post-discharge placement for this patient at the [place] level of care (*CSW will initial, date and re-position this form in  chart as items are completed):  Yes   Patient/family provided with [facility] Work Department's list of facilities offering this level of care within the geographic area requested by the patient (or if unable, by the patient's family).  Yes   Patient/family informed of their freedom to choose among providers that offer the needed level of care, that participate in Medicare, Medicaid or managed care program needed by the patient, have an available bed and are willing to accept the patient.  Yes   Patient/family informed of [facility]'s ownership interest in [facility] and [facility], as well as of the fact that they are under no obligation to receive care at these facilities.  PASRR submitted to EDS on 07/15/15     PASRR number received on 07/15/15     Existing PASRR number confirmed on       FL2 transmitted to all facilities in geographic area requested by pt/family on 07/15/15     FL2 transmitted to all facilities within larger geographic area on       Patient informed that his/her managed care company has contracts with or will negotiate with certain facilities, including the following:        Yes   Patient/family informed of bed offers received.  Patient chooses bed at [facility]     Physician recommends and patient chooses bed at      Patient to be transferred to [facility] on 07/16/15.  Patient to be transferred to facility by [facility]     Patient family notified on 07/16/15 of transfer.  Name of family member notified:  Dtr at bedside     PHYSICIAN       Additional Comment:     _______________________________________________ Boone Master, [place] 07/16/2015, 4:59 PM

## 2015-08-24 ENCOUNTER — Ambulatory Visit: Payer: Medicare Other | Admitting: Hematology & Oncology

## 2015-08-24 ENCOUNTER — Other Ambulatory Visit: Payer: Medicare Other

## 2016-02-28 ENCOUNTER — Other Ambulatory Visit: Payer: Self-pay | Admitting: *Deleted

## 2016-02-28 DIAGNOSIS — C9110 Chronic lymphocytic leukemia of B-cell type not having achieved remission: Secondary | ICD-10-CM

## 2016-02-29 ENCOUNTER — Ambulatory Visit (HOSPITAL_BASED_OUTPATIENT_CLINIC_OR_DEPARTMENT_OTHER): Payer: Medicare Other | Admitting: Hematology & Oncology

## 2016-02-29 ENCOUNTER — Other Ambulatory Visit (HOSPITAL_BASED_OUTPATIENT_CLINIC_OR_DEPARTMENT_OTHER): Payer: Medicare Other

## 2016-02-29 ENCOUNTER — Encounter: Payer: Self-pay | Admitting: Hematology & Oncology

## 2016-02-29 VITALS — BP 166/69 | HR 75 | Temp 98.4°F | Resp 16 | Ht 62.0 in | Wt 139.0 lb

## 2016-02-29 DIAGNOSIS — S32301A Unspecified fracture of right ilium, initial encounter for closed fracture: Secondary | ICD-10-CM

## 2016-02-29 DIAGNOSIS — C9110 Chronic lymphocytic leukemia of B-cell type not having achieved remission: Secondary | ICD-10-CM

## 2016-02-29 DIAGNOSIS — Z853 Personal history of malignant neoplasm of breast: Secondary | ICD-10-CM

## 2016-02-29 LAB — COMPREHENSIVE METABOLIC PANEL
ALT: 12 U/L (ref 0–55)
AST: 17 U/L (ref 5–34)
Albumin: 4.3 g/dL (ref 3.5–5.0)
Alkaline Phosphatase: 61 U/L (ref 40–150)
Anion Gap: 9 mEq/L (ref 3–11)
BUN: 16.6 mg/dL (ref 7.0–26.0)
CO2: 25 meq/L (ref 22–29)
CREATININE: 0.7 mg/dL (ref 0.6–1.1)
Calcium: 9.2 mg/dL (ref 8.4–10.4)
Chloride: 104 mEq/L (ref 98–109)
EGFR: 83 mL/min/{1.73_m2} — ABNORMAL LOW (ref >90)
GLUCOSE: 99 mg/dL (ref 70–140)
Potassium: 4.4 mEq/L (ref 3.5–5.1)
SODIUM: 138 meq/L (ref 136–145)
TOTAL PROTEIN: 6.4 g/dL (ref 6.4–8.3)
Total Bilirubin: 0.72 mg/dL (ref 0.20–1.20)

## 2016-02-29 LAB — CHCC SATELLITE - SMEAR

## 2016-02-29 LAB — MANUAL DIFFERENTIAL (CHCC SATELLITE)
ALC: 27.7 10*3/uL — ABNORMAL HIGH (ref 0.6–2.2)
ANC (CHCC HP manual diff): 3.1 10*3/uL (ref 1.5–6.7)
LYMPH: 89 % — AB (ref 14–48)
MONO: 1 % (ref 0–13)
PLT EST ~~LOC~~: DECREASED
Platelet Morphology: NORMAL
RBC Comments: NORMAL
SEG: 10 % — AB (ref 40–75)

## 2016-02-29 LAB — LACTATE DEHYDROGENASE: LDH: 189 U/L (ref 125–245)

## 2016-02-29 LAB — CBC WITH DIFFERENTIAL (CANCER CENTER ONLY)
HCT: 43.7 % (ref 34.8–46.6)
HGB: 14.9 g/dL (ref 11.6–15.9)
MCH: 32.3 pg (ref 26.0–34.0)
MCHC: 34.1 g/dL (ref 32.0–36.0)
MCV: 95 fL (ref 81–101)
PLATELETS: 100 10*3/uL — AB (ref 145–400)
RBC: 4.61 10*6/uL (ref 3.70–5.32)
RDW: 13.4 % (ref 11.1–15.7)
WBC: 31.2 10*3/uL — ABNORMAL HIGH (ref 3.9–10.0)
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
## 2016-08-27 ENCOUNTER — Other Ambulatory Visit: Payer: Self-pay | Admitting: Family Medicine

## 2016-08-27 DIAGNOSIS — R011 Cardiac murmur, unspecified: Secondary | ICD-10-CM

## 2016-09-07 HISTORY — PX: TRANSTHORACIC ECHOCARDIOGRAM: SHX275

## 2016-09-09 ENCOUNTER — Other Ambulatory Visit (HOSPITAL_COMMUNITY): Payer: Medicare Other

## 2016-09-16 ENCOUNTER — Ambulatory Visit: Payer: Medicare Other | Admitting: Neurology

## 2016-09-25 ENCOUNTER — Other Ambulatory Visit: Payer: Self-pay

## 2016-09-25 ENCOUNTER — Ambulatory Visit (HOSPITAL_COMMUNITY): Payer: Medicare Other | Attending: Family Medicine

## 2016-09-25 DIAGNOSIS — R011 Cardiac murmur, unspecified: Secondary | ICD-10-CM | POA: Insufficient documentation

## 2016-09-25 HISTORY — PX: TRANSTHORACIC ECHOCARDIOGRAM: SHX275

## 2016-09-30 ENCOUNTER — Telehealth: Payer: Self-pay | Admitting: *Deleted

## 2016-09-30 ENCOUNTER — Ambulatory Visit: Payer: Medicare Other | Admitting: Neurology

## 2016-09-30 NOTE — Telephone Encounter (Signed)
No showed new patient appointment. 

## 2016-10-23 ENCOUNTER — Telehealth: Payer: Self-pay

## 2016-10-23 NOTE — Telephone Encounter (Signed)
NOTES SENT TO SCHEDULING.  °

## 2016-11-03 ENCOUNTER — Telehealth: Payer: Self-pay | Admitting: Cardiology

## 2016-11-03 NOTE — Telephone Encounter (Signed)
Received records from [place] for appointment on 11/14/16 with Dr Ellyn Hack.  Records given to [facility] (medical records) for Dr Allison Quarry schedule on 11/14/16. lp

## 2016-11-14 ENCOUNTER — Ambulatory Visit (INDEPENDENT_AMBULATORY_CARE_PROVIDER_SITE_OTHER): Payer: Medicare Other | Admitting: Cardiology

## 2016-11-14 ENCOUNTER — Encounter: Payer: Self-pay | Admitting: Cardiology

## 2016-11-14 VITALS — BP 154/70 | HR 75 | Ht 62.0 in | Wt 147.0 lb

## 2016-11-14 DIAGNOSIS — E784 Other hyperlipidemia: Secondary | ICD-10-CM | POA: Diagnosis not present

## 2016-11-14 DIAGNOSIS — I340 Nonrheumatic mitral (valve) insufficiency: Secondary | ICD-10-CM | POA: Diagnosis not present

## 2016-11-14 DIAGNOSIS — I10 Essential (primary) hypertension: Secondary | ICD-10-CM | POA: Diagnosis not present

## 2016-11-14 DIAGNOSIS — Z79899 Other long term (current) drug therapy: Secondary | ICD-10-CM

## 2016-11-14 DIAGNOSIS — E7849 Other hyperlipidemia: Secondary | ICD-10-CM

## 2016-11-14 DIAGNOSIS — I700 Atherosclerosis of aorta: Secondary | ICD-10-CM | POA: Diagnosis not present

## 2016-11-14 MED ORDER — LOSARTAN POTASSIUM 25 MG PO TABS: 25.0000 mg | ORAL_TABLET | Freq: Every day | ORAL | 3 refills | Status: DC

## 2016-11-14 NOTE — Progress Notes (Signed)
PCP: Gerrit Heck, MD  Clinic Note: Chief Complaint  Patient presents with  . New Patient (Initial Visit)    refered by Dr Anibal Henderson for ? mitral vavle disorder.  Heather Knapp Heart Murmur    HPI: Heather Knapp is a 82 y.o. female with a PMH below who presents today for Cardiology Consultation for abnormal findings on recent Echo.Heather Knapp was seen by Dr. Irish Lack (At That Time with [facility]) - apparently an EKG showed inferior Q waves. He commented on echocardiogram that was done as part of her health screening that showed no regional wall motion and amount is, no comment on her Valves.  [name] was referred by Dr. Drema Dallas (does not follow with PCP routinely) -- Was noted that she has a history of heart murmur with an echocardiogram is a factor MMII showing mild aortic sclerosis with mitral regurgitation. Last documented echo think is from back in 2011, so she ordered a f/u echo (see below)  Recent Hospitalizations: none  Studies Reviewed:   2-D echo 09/25/2016: EF 55-60%. Severe mitral annular calcification with possible posterior leaflet prolapse with trivial MR.  Interval History: Heather Knapp presents for follow-up evaluation of her having heart disease that has been mild on echo. There is significant calcification noted on the leaflets, but no significant stenosis or regurgitation. Clinically, she is very stable She has no cardiac complaints besides a couple hard heartbeats at night when she lies down to sleep Review of symptoms as follows:   No chest pain or shortness of breath with rest or exertion.  No PND, orthopnea or edema.  No palpitations, lightheadedness, dizziness, weakness or syncope/near syncope.  No TIA/amaurosis fugax symptoms. No claudication.  ROS: A comprehensive was performed. Review of Systems  Constitutional: Negative.  Negative for chills, fever, malaise/fatigue and weight loss.  HENT: Negative.  Negative for congestion and sinus pain.     Eyes: Negative.   Respiratory: Negative.  Negative for cough, shortness of breath and wheezing.   Cardiovascular: Negative.   Gastrointestinal: Negative.  Negative for blood in stool and melena.  Genitourinary: Negative for hematuria.  Musculoskeletal: Negative.   Skin: Negative.   Neurological: Negative.   Endo/Heme/Allergies: Negative.   Psychiatric/Behavioral: Negative.   All other systems reviewed and are negative.   Past Medical History:  Diagnosis Date  . CLL (chronic lymphocytic leukemia) ([place])   . History of left breast cancer   . Hyperlipidemia   . Hypertension     Past Surgical History:  Procedure Laterality Date  . TRANSTHORACIC ECHOCARDIOGRAM  09/25/2016   Severe mitral annular calcification with possible posterior leaflet prolapse with trivial MR.    No outpatient prescriptions have been marked as taking for the 11/14/16 encounter (Office Visit) with Leonie Man, MD.  -- Not currently taking any medications.  No Known Allergies  Social History   Social History  . Marital status: Widowed    Spouse name: N/A  . Number of children: N/A  . Years of education: N/A   Social History Main Topics  . Smoking status: Never Smoker  . Smokeless tobacco: Never Used     Comment: never used tobacco  . Alcohol use No  . Drug use: No  . Sexual activity: Not Asked   Other Topics Concern  . None   Social History Narrative  . None    family history includes Hypertension in her father.  Wt Readings from Last 3 Encounters:  11/14/16 66.7 kg (147 lb)  02/29/16 63  kg (139 lb)  07/13/15 57.2 kg (126 lb)    PHYSICAL EXAM BP (!) 154/70 (BP Location: Right Arm, Cuff Size: Normal)   Pulse 75   Ht 5\' 2"  (1.575 m)   Wt 66.7 kg (147 lb)   BMI 26.89 kg/m  General appearance: alert, cooperative, appears stated age, no distress and healthy appearing. Well-nourished, well-groomed. Normal mood and affect HEENT: [place]/AT, EOMI, MMM, anicteric sclera Neck: no  adenopathy, no carotid bruit and no JVD Lungs: clear to auscultation bilaterally, normal percussion bilaterally and non-labored Heart: regular rate and rhythm, S1  & S2 normal, No click, rub or gallop, non-displaced PMI; soft 2/6 SEM at RUSB radiating to carotids. No diastolic murmur. Abdomen: soft, non-tender; bowel sounds normal; no masses,  no organomegaly; no HSM Extremities: extremities normal, atraumatic, no cyanosis, and edema Pulses: 2+ and symmetric;  Skin: mobility and turgor normal, no evidence of bleeding or bruising and no lesions noted  Neurologic: Mental status: Alert, oriented, thought content appropriate Cranial nerves: normal (II-XII grossly intact)    Adult ECG Report  Rate: 75 ;  Rhythm: normal sinus rhythm and CRO Inferior & Anterior MI, age undetermined. Normal axis, intervals & durations;   Narrative Interpretation: Stable EKG   Other studies Reviewed: Additional studies/ records that were reviewed today include:  Recent Labs:  September 2017  Total cholesterol 187, cholesterol 124, HDL 55, LDL 117.  CBC: W 44.6 (has CLL), H/H1 44/40.3, platelets 108: TSH 1.33   Sodium 134, potassium 4.8, chloride 99, bicarbonate 31, BUN 10, creatinine 0.75, glucose 92, calcium 9.6; total protein 5.9, albumin 4.3, total bili 0.8, alkaline phosphatase 50, AST/ALT 17/14   ASSESSMENT / PLAN: Problem List Items Addressed This Visit    Essential hypertension - Primary (Chronic)    Blood pressure still high today compared to with PCP relatively stable indicating hypertension as a diagnosis. Plan start losartan 25 mg daily. We'll check a BMP in about 2 weeks. Can then follow-up with a PA or Pharm-D in roughly 2 months      Relevant Medications   losartan (COZAAR) 25 MG tablet   Other Relevant Orders   EKG 12-Lead   ECHOCARDIOGRAM COMPLETE   Basic metabolic panel   Hyperlipidemia (Chronic)    Lipids are not quite at goal for someone her age. The plan think the PCP is monitor  with [place] modifications. I suspect that she will meet target and will probably need therapy. As the pathophysiology of her valvular lesions are similar to CAD, target LDL less than 100.      Relevant Medications   losartan (COZAAR) 25 MG tablet   Mild mitral regurgitation    Significant mitral valve calcification but only annular. Only mild MR with minimal murmur.  I spent quite a bit of time explaining the pathophysiology of valve disease to the patient. Plan to follow-up with an echo cardiac exam next year to reassess. If stable then would simply just monitor couple years.      Relevant Medications   losartan (COZAAR) 25 MG tablet   Other Relevant Orders   EKG 12-Lead   ECHOCARDIOGRAM COMPLETE   Basic metabolic panel   Aortic atherosclerosis ([place])    Noted on imaging studies. Would therefore benefit from more aggressive lipid and blood pressure management. Target LDL will be less than 100 and will need better blood pressure control. Adding losartan. Not currently on a statin.      Relevant Medications   losartan (COZAAR) 25 MG tablet   Other  Relevant Orders   EKG 12-Lead   ECHOCARDIOGRAM COMPLETE   Basic metabolic panel   Medication management   Relevant Orders   Basic metabolic panel      Current medicines are reviewed at length with the patient today. (+/- concerns) none The following changes have been made: see below  Patient Instructions  Medication Start  Losartan 25 mg one tablet daily    Lab in 1 to 2 weeks after starting Losartan. May have labs anytime of the day.    Your physician recommends that you schedule a follow-up appointment in 2 MONTHS WITH CVRR - BLOOD PRESSURE   In 12 months SCHEDULE [address] [place] has requested that you have an echocardiogram IN  DEC. 2018. Echocardiography is a painless test that uses sound waves to create images of your heart. It provides your doctor with information about the size and  shape of your heart and how well your heart's chambers and valves are working. This procedure takes approximately one hour. There are no restrictions for this procedure.     Your physician wants you to follow-up in 12 months with Dr Ellyn Hack- f/u echo results You will receive a reminder letter in the mail two months in advance. If you don't receive a letter, please call our office to schedule the follow-up appointment.   If you need a refill on your cardiac medications before your next appointment, please call your pharmacy.    Studies Ordered:   Orders Placed This Encounter  Procedures  . Basic metabolic panel  . EKG 12-Lead  . ECHOCARDIOGRAM COMPLETE      Glenetta Hew, M.D., M.S. Interventional Cardiologist   Pager # [phone] Phone # [phone] [address] [place], [place] [address]

## 2016-11-14 NOTE — Patient Instructions (Signed)
Medication Start  Losartan 25 mg one tablet daily    Lab in 1 to 2 weeks after starting Losartan. May have labs anytime of the day.    Your physician recommends that you schedule a follow-up appointment in 2 MONTHS WITH CVRR - BLOOD PRESSURE   In 12 months SCHEDULE [address] [place] has requested that you have an echocardiogram IN  DEC. 2018. Echocardiography is a painless test that uses sound waves to create images of your heart. It provides your doctor with information about the size and shape of your heart and how well your heart's chambers and valves are working. This procedure takes approximately one hour. There are no restrictions for this procedure.     Your physician wants you to follow-up in 12 months with Dr Ellyn Hack- f/u echo results You will receive a reminder letter in the mail two months in advance. If you don't receive a letter, please call our office to schedule the follow-up appointment.   If you need a refill on your cardiac medications before your next appointment, please call your pharmacy.

## 2016-11-16 ENCOUNTER — Encounter: Payer: Self-pay | Admitting: Cardiology

## 2016-11-16 NOTE — Assessment & Plan Note (Signed)
Noted on imaging studies. Would therefore benefit from more aggressive lipid and blood pressure management. Target LDL will be less than 100 and will need better blood pressure control. Adding losartan. Not currently on a statin.

## 2016-11-16 NOTE — Assessment & Plan Note (Signed)
Blood pressure still high today compared to with PCP relatively stable indicating hypertension as a diagnosis. Plan start losartan 25 mg daily. We'll check a BMP in about 2 weeks. Can then follow-up with a PA or Pharm-D in roughly 2 months

## 2016-11-16 NOTE — Assessment & Plan Note (Signed)
Significant mitral valve calcification but only annular. Only mild MR with minimal murmur.  I spent quite a bit of time explaining the pathophysiology of valve disease to the patient. Plan to follow-up with an echo cardiac exam next year to reassess. If stable then would simply just monitor couple years.

## 2016-11-16 NOTE — Assessment & Plan Note (Signed)
Lipids are not quite at goal for someone her age. The plan think the PCP is monitor with [place] modifications. I suspect that she will meet target and will probably need therapy. As the pathophysiology of her valvular lesions are similar to CAD, target LDL less than 100.

## 2016-12-11 ENCOUNTER — Ambulatory Visit (INDEPENDENT_AMBULATORY_CARE_PROVIDER_SITE_OTHER): Payer: Medicare Other | Admitting: Neurology

## 2016-12-11 ENCOUNTER — Encounter: Payer: Self-pay | Admitting: Neurology

## 2016-12-11 VITALS — BP 146/66 | HR 85 | Ht 62.0 in | Wt 146.8 lb

## 2016-12-11 DIAGNOSIS — G3184 Mild cognitive impairment, so stated: Secondary | ICD-10-CM

## 2016-12-11 NOTE — Progress Notes (Signed)
PATIENT: Heather Knapp DOB: 1934-07-07  Chief Complaint  Patient presents with  . Memory Loss    MMSE 29/30 - 11 animals.  She is concerned about her declining short-term memory over the last year.  Marland Kitchen PCP    Leighton Ruff, MD     HISTORICAL  Heather Knapp is a 83 years old right-handed female, seen in refer by her primary care doctor Leighton Ruff for evaluation of memory loss, initial evaluation was on January 4th 2018.  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  She could not recall how many years she has widowed, she could not remember exactly disease her husband died from. She has 2 children lives outside of [place], visit her occasionally. She was brought in by her neighbor today but alone at today's clinical visit.  She noticed gradual onset memory trouble over the past few years, difficulty with word finding, she works 16 hours in a week, denies difficulty managing household bill, there was no family history of memory loss. She has good appetite, sleeps well.  I reviewed laboratory evaluations on September 2017, elevated WBC 24.6, platelet 108, hemoglobin 14.4, normal BMP, creatinine 0.75, elevated LDL 117, cholesterol 197, vitamin B12 454, TSH 1.3 3, RPR negative,  REVIEW OF SYSTEMS: Full 14 system  review of systems performed and notable only for memory loss  ALLERGIES: No Known Allergies  HOME MEDICATIONS: Current Outpatient Prescriptions  Medication Sig Dispense Refill  . Fish Oil-Cholecalciferol (FISH OIL + D3 PO) Take 1 tablet by mouth every morning.     Marland Kitchen losartan (COZAAR) 25 MG tablet Take 1 tablet (25 mg total) by mouth daily. 90 tablet 3  . Probiotic Product (ALIGN PO) Take 1 tablet by mouth daily after breakfast.     . TOPROL XL 100 MG 24 hr tablet Take 100 mg by mouth Daily.     No current facility-administered medications for this visit.     PAST MEDICAL HISTORY: Past Medical History:  Diagnosis Date  . CLL (chronic lymphocytic leukemia) ([place])   . History of left breast cancer   . Hyperlipidemia   . Hypertension     PAST SURGICAL HISTORY: Past Surgical History:  Procedure Laterality Date  . MASTECTOMY  1998  . TRANSTHORACIC ECHOCARDIOGRAM  09/25/2016   Severe mitral annular calcification with possible posterior leaflet prolapse with trivial MR.    FAMILY HISTORY: Family History  Problem Relation Age of Onset  . Hypertension Father     SOCIAL HISTORY:  Social History   Social History  . Marital status: Widowed    Spouse name: N/A  . Number of children: 2  . Years of education: Masters   Occupational History  . Retired    Social History Main Topics  . Smoking status: Never Smoker  . Smokeless tobacco: Never Used     Comment: never used tobacco  . Alcohol  use 0.0 oz/week     Comment: 8oz glass of wine daily  . Drug use: No  . Sexual activity: Not on file   Other Topics Concern  . Not on file   Social History Narrative   Lives at home alone.   Right-handed.   4 cups caffeine daily.     PHYSICAL EXAM   Vitals:   12/11/16 0928  BP: (!) 146/66  Pulse: 85  Weight: 146 lb 12 oz (66.6 kg)  Height: 5\' 2"  (1.575 m)    Not recorded      Body mass index is 26.84 kg/m.  PHYSICAL EXAMNIATION:  Gen: NAD, conversant, well  nourised, obese, well groomed                     Cardiovascular: Regular rate rhythm, no peripheral edema, warm, nontender. Eyes: Conjunctivae clear without exudates or hemorrhage Neck: Supple, no carotid bruits. Pulmonary: Clear to auscultation bilaterally   NEUROLOGICAL EXAM:  MENTAL STATUS: Speech:    Speech is normal; fluent and spontaneous with normal comprehension.  Cognition:Mini-Mental Status Examination 29/30, animal naming 11     Orientation to time, place and person      recent and remote memory: She missed one out of 3 recall     Normal Attention span and concentration     Normal Language, naming, repeating,spontaneous speech     Fund of knowledge   CRANIAL NERVES: CN II: Visual fields are full to confrontation. Fundoscopic exam is normal with sharp discs and no vascular changes. Pupils are round equal and briskly reactive to light. CN III, IV, VI: extraocular movement are normal. No ptosis. CN V: Facial sensation is intact to pinprick in all 3 divisions bilaterally. Corneal responses are intact.  CN VII: Face is symmetric with normal eye closure and smile. CN VIII: Hearing is normal to rubbing fingers CN IX, X: Palate elevates symmetrically. Phonation is normal. CN XI: Head turning and shoulder shrug are intact CN XII: Tongue is midline with normal movements and no atrophy.  MOTOR: There is no pronator drift of out-stretched arms. Muscle bulk and tone are normal. Muscle strength is normal.  REFLEXES: Reflexes are 2+ and symmetric at the biceps, triceps, knees, and ankles. Plantar responses are flexor.  SENSORY: Intact to light touch, pinprick, positional sensation and vibratory sensation are intact in fingers and toes.  COORDINATION: Rapid alternating movements and fine finger movements are intact. There is no dysmetria on finger-to-nose and heel-knee-shin.    GAIT/STANCE: Posture is normal. Gait is steady with normal steps, base, arm swing, and turning. Heel  and toe walking are normal. Tandem gait is normal.  Romberg is absent.   DIAGNOSTIC DATA (LABS, IMAGING, TESTING) - I reviewed patient records, labs, notes, testing and imaging myself where available.   ASSESSMENT AND PLAN  Heather Knapp is a 83 y.o. female   Mild cognitive impairment  MRI of the brain without contrast  Laboratory evaluation showed no treatable etiology    Marcial Pacas, M.D. Ph.D.  [facility] [address], [place], [place] [address] Ph: [phone] Fax: [phone]  CC: Leighton Ruff, MD

## 2016-12-12 ENCOUNTER — Telehealth: Payer: Self-pay | Admitting: Neurology

## 2016-12-12 NOTE — Telephone Encounter (Signed)
Patients daughter Sydel Wilensky ( ON D[facility]) called to see how patients appt went on 12/12/15 daughter [place] of [place]. Please call

## 2016-12-12 NOTE — Telephone Encounter (Signed)
I have spoken with Heather Knapp this afternoon and explained that Dr. Krista Blue did see pt. yesterday, dx. of mild cog. impairment, and MRI brain and labwork were ordered to r/o a treatable cause.  Heather Knapp sts. pt. has stated she is not going to have labwork/MRI b/c "there's not anything they can do for me," and requested guidance on how to convince pt. to have tests.  I have explained, that, although we can't force pt. to have labwork/MRI, she may explain to her that if there is a treatable cause for cog. decline, such as infection, electrolyte imbalance, tumors, etc. this is the best way to detect those possible causes so that timely tx. may be given.  Heather Knapp verbalized understanding of same/fim

## 2017-01-16 ENCOUNTER — Ambulatory Visit: Payer: Medicare Other

## 2017-02-23 ENCOUNTER — Telehealth: Payer: Self-pay | Admitting: Hematology & Oncology

## 2017-02-23 NOTE — Telephone Encounter (Signed)
Patient called and left a message wanting to cancel 02/27/2017 appt with Dr. Marin Olp. I called patient to inform her that we had already cancelled her appt on 12/11/2016 when she called at that time. Patient had forgotten that she had cancelled this appt back in January 2018.       [facility] MARCH 2018  [facility].

## 2017-02-27 ENCOUNTER — Ambulatory Visit: Payer: Medicare Other | Admitting: Hematology & Oncology

## 2017-02-27 ENCOUNTER — Other Ambulatory Visit: Payer: Medicare Other

## 2017-03-11 ENCOUNTER — Ambulatory Visit: Payer: Medicare Other | Admitting: Neurology

## 2017-09-28 ENCOUNTER — Encounter: Payer: Self-pay | Admitting: Hematology & Oncology

## 2017-10-01 ENCOUNTER — Telehealth (HOSPITAL_COMMUNITY): Payer: Self-pay | Admitting: Cardiology

## 2017-10-01 NOTE — Telephone Encounter (Signed)
User: Cherie Dark A Date/time: 09/29/17 1:54 PM  Comment: Called pt and lmsg for her to CB to get scheduled for echo.Vassie Moment  Context: [place] Schedule Orders/Appt Requests Outcome: Left Message  Phone number: [phone] Phone Type: Home Phone  Comm. type: Telephone Call type: Outgoing  Contact: Dwana Melena A Relation to patient: Self    User: Cherie Dark A Date/time: 09/24/17 10:16 AM  Comment: Called pt and lmsg for her to CB to get scheduled for echo.Vassie Moment  Context: [place] Schedule Orders/Appt Requests Outcome: Left Message  Phone number: [phone] Phone Type: Home Phone  Comm. type: Telephone Call type: Outgoing  Contact: Dwana Melena A Relation to patient: Self

## 2017-11-13 ENCOUNTER — Other Ambulatory Visit (HOSPITAL_COMMUNITY): Payer: Medicare Other

## 2017-11-20 ENCOUNTER — Other Ambulatory Visit (HOSPITAL_COMMUNITY): Payer: Medicare Other

## 2017-12-03 ENCOUNTER — Encounter: Payer: Self-pay | Admitting: *Deleted

## 2017-12-08 HISTORY — PX: TRANSTHORACIC ECHOCARDIOGRAM: SHX275

## 2017-12-11 ENCOUNTER — Ambulatory Visit: Payer: Medicare Other | Admitting: Physician Assistant

## 2017-12-18 ENCOUNTER — Other Ambulatory Visit: Payer: Self-pay

## 2017-12-18 ENCOUNTER — Ambulatory Visit (HOSPITAL_COMMUNITY): Payer: Medicare Other | Attending: Cardiology

## 2017-12-18 DIAGNOSIS — I700 Atherosclerosis of aorta: Secondary | ICD-10-CM | POA: Diagnosis present

## 2017-12-18 DIAGNOSIS — I10 Essential (primary) hypertension: Secondary | ICD-10-CM | POA: Insufficient documentation

## 2017-12-18 DIAGNOSIS — E785 Hyperlipidemia, unspecified: Secondary | ICD-10-CM | POA: Diagnosis not present

## 2017-12-18 DIAGNOSIS — I340 Nonrheumatic mitral (valve) insufficiency: Secondary | ICD-10-CM

## 2017-12-18 DIAGNOSIS — I081 Rheumatic disorders of both mitral and tricuspid valves: Secondary | ICD-10-CM | POA: Insufficient documentation

## 2017-12-25 ENCOUNTER — Telehealth: Payer: Self-pay | Admitting: *Deleted

## 2017-12-25 NOTE — Telephone Encounter (Signed)
Left message to cal back - daughter Karen's voice mail  For test results , annual appointment 01/08/18

## 2017-12-25 NOTE — Telephone Encounter (Signed)
-----   Message from Leonie Man, MD sent at 12/20/2017  3:00 PM EST ----- Echocardiogram result: Normal left ventricle size and function.  Ejection fraction in the normal range at 60-65%.  No regional wall motion abnormalities.  Only grade 1 diastolic dysfunction -- which is normal for age. Mitral valve does have significant calcium on the rim of the Valve, but only mild regurgitation noted.  Nothing significant at this time.  Pulmonary pressures do appear to be moderately increased with suspect primarily from a pulmonary etiology.  Glenetta Hew, MD

## 2018-01-08 ENCOUNTER — Ambulatory Visit: Payer: Medicare Other | Admitting: Physician Assistant

## 2018-01-08 ENCOUNTER — Encounter: Payer: Self-pay | Admitting: Physician Assistant

## 2018-01-08 VITALS — BP 152/70 | HR 76 | Ht 62.0 in | Wt 152.0 lb

## 2018-01-08 DIAGNOSIS — I10 Essential (primary) hypertension: Secondary | ICD-10-CM

## 2018-01-08 DIAGNOSIS — I340 Nonrheumatic mitral (valve) insufficiency: Secondary | ICD-10-CM | POA: Diagnosis not present

## 2018-01-08 DIAGNOSIS — C9110 Chronic lymphocytic leukemia of B-cell type not having achieved remission: Secondary | ICD-10-CM

## 2018-01-08 DIAGNOSIS — E785 Hyperlipidemia, unspecified: Secondary | ICD-10-CM | POA: Diagnosis not present

## 2018-01-08 DIAGNOSIS — C9190 Lymphoid leukemia, unspecified not having achieved remission: Secondary | ICD-10-CM

## 2018-01-08 MED ORDER — LOSARTAN POTASSIUM 25 MG PO TABS: 25.0000 mg | ORAL_TABLET | Freq: Every day | ORAL | 3 refills | Status: DC

## 2018-01-08 NOTE — Progress Notes (Signed)
Cardiology Office Note    Date:  01/10/2018   ID:  Heather Knapp, DOB 01-16-1934, MRN [id number]  PCP:  Leighton Ruff, MD  Cardiologist:  Dr. Ellyn Hack   Chief Complaint  Patient presents with  . Follow-up    follow up from ECHO. Seen for Dr. Ellyn Hack    History of Present Illness:  STEPANIE Knapp is a 84 y.o. female with PMH of CLL, L breast CA, HTN, HLD and mitral valve disease.  Patient first seen Dr. Ellyn Hack in December 2017 for severe mitral annular calcification was possible posterior leaflet prolapse with trivial mitral regurgitation was seen on echocardiogram.  His blood pressure was elevated during the last office visit, losartan 25 mg daily was started.  She also had a history of aortic atherosclerosis seen on the previous imaging study.  Target LDL is less than 100.  Patient presents to cardiology office today for follow-up.  Recent lab work does show her LDL is 122, HDL is 50.  She is quite active at home and able to climb up at least 2 flight of stairs or walk 2 blocks away from her home and back without any issue.  She does not have any exertional chest discomfort or shortness of breath.  She has self discontinued losartan as she did not think it was helping with her blood pressure.  Her systolic blood pressure is in the 150s today even on manual recheck by myself.  156/76 on manual recheck.  I will restart losartan 25 mg daily.  She will need a basic metabolic panel in 2 weeks.  She can follow-up with Dr. Ellyn Hack in 3-35-month for up titration of blood pressure medication and follow-up.  During the meantime, she will keep weekly blood pressure diary.  Recent echocardiogram only showed mild mitral regurgitation, normal ejection fraction.  The result is quite reassuring.  She has not seen her hematologist Dr. Marin Olp since 2017 as she thought Dr. Marin Olp moved to [facility] region.  Her daughter is with her today, she was double check if Dr. Marin Olp is still working in [place],  she can reestablish with Dr. Marin Olp if he indeed works in [place] area.  Alternatively, if they are unable to travel to [facility], they can potentially establish other hematologist such as Dr. Alvy Bimler Ni.  She does not have any symptoms associated with CLL, however recent lab work continue to show her white blood cell count remained around 30.  Platelet level was mildly low in the 110 range.    Past Medical History:  Diagnosis Date  . CLL (chronic lymphocytic leukemia) ([place])   . History of left breast cancer   . Hyperlipidemia   . Hypertension     Past Surgical History:  Procedure Laterality Date  . MASTECTOMY  1998  . TRANSTHORACIC ECHOCARDIOGRAM  09/25/2016   Severe mitral annular calcification with possible posterior leaflet prolapse with trivial MR.    Current Medications: Outpatient Medications Prior to Visit  Medication Sig Dispense Refill  . Fish Oil-Cholecalciferol (FISH OIL + D3 PO) Take 1 tablet by mouth every morning.     . Probiotic Product (ALIGN PO) Take 1 tablet by mouth daily after breakfast.     . TOPROL XL 100 MG 24 hr tablet Take 100 mg by mouth Daily.    Marland Kitchen losartan (COZAAR) 25 MG tablet Take 1 tablet (25 mg total) by mouth daily. 90 tablet 3   No facility-administered medications prior to visit.      Allergies:  Patient has no known allergies.   Social History   Socioeconomic History  . Marital status: Widowed    Spouse name: None  . Number of children: 2  . Years of education: Masters  . Highest education level: None  Social Needs  . Financial resource strain: None  . Food insecurity - worry: None  . Food insecurity - inability: None  . Transportation needs - medical: None  . Transportation needs - non-medical: None  Occupational History  . Occupation: Retired  Tobacco Use  . Smoking status: Never Smoker  . Smokeless tobacco: Never Used  . Tobacco comment: never used tobacco  Substance and Sexual Activity  . Alcohol use: Yes     Alcohol/week: 0.0 oz    Comment: 8oz glass of wine daily  . Drug use: No  . Sexual activity: None  Other Topics Concern  . None  Social History Narrative   Lives at home alone.   Right-handed.   4 cups caffeine daily.     Family History:  The patient's family history includes Hypertension in her father.   ROS:   Please see the history of present illness.    ROS All other systems reviewed and are negative.   PHYSICAL EXAM:   VS:  BP (!) 152/70   Pulse 76   Ht 5\' 2"  (1.575 m)   Wt 152 lb (68.9 kg)   BMI 27.80 kg/m    GEN: Well nourished, well developed, in no acute distress  HEENT: normal  Neck: no JVD, carotid bruits, or masses Cardiac: RRR; no murmurs, rubs, or gallops,no edema  Respiratory:  clear to auscultation bilaterally, normal work of breathing GI: soft, nontender, nondistended, + BS MS: no deformity or atrophy  Skin: warm and dry, no rash Neuro:  Alert and Oriented x 3, Strength and sensation are intact Psych: euthymic mood, full affect  Wt Readings from Last 3 Encounters:  01/08/18 152 lb (68.9 kg)  12/11/16 146 lb 12 oz (66.6 kg)  11/14/16 147 lb (66.7 kg)      Studies/Labs Reviewed:   EKG:  EKG is not ordered today.    Recent Labs: No results found for requested labs within last 8760 hours.   Lipid Panel    Component Value Date/Time   CHOL 210 (H) 04/07/2008 1050   TRIG 104 04/07/2008 1050   HDL 46 04/07/2008 1050   CHOLHDL 4.6 04/07/2008 1050   VLDL 21 04/07/2008 1050   LDLCALC 143 (H) 04/07/2008 1050    Additional studies/ records that were reviewed today include:    Echo 12/18/2017 LV EF: 60% -   65%  ------------------------------------------------------------------- Indications:      (I34.0).  ------------------------------------------------------------------- History:   PMH:  Acquired from the patient and from the patient&'s chart.  Mitral regurgitation.  Aortic valve disease.  Risk factors:  Hypertension.  Dyslipidemia.  ------------------------------------------------------------------- Study Conclusions  - Left ventricle: The cavity size was normal. Systolic function was   normal. The estimated ejection fraction was in the range of 60%   to 65%. Wall motion was normal; there were no regional wall   motion abnormalities. Doppler parameters are consistent with   abnormal left ventricular relaxation (grade 1 diastolic   dysfunction). Doppler parameters are consistent with high   ventricular filling pressure. - Mitral valve: Severely calcified annulus. There was mild   regurgitation. - Pulmonary arteries: Systolic pressure was moderately increased.   PA peak pressure: 52 mm Hg (S).    ASSESSMENT:    1. Essential  hypertension   2. CLL (chronic lymphocytic leukemia) ([place])   3. Hyperlipidemia, unspecified hyperlipidemia type   4. Mild mitral regurgitation      PLAN:  In order of problems listed above:  1. Hypertension: Blood pressure elevated today, patient has self discontinued her losartan, restart losartan 25 mg daily.  2. CLL: Previously followed by Dr. Marin Olp, last office visit was in 2017, she needs to reestablish with hematology service.  Recent lab work shows her white blood cell continue to be around 30  3. Hyperlipidemia: LDL elevated, she wished to do a trial of diet and exercise before starting on medical therapy.  4. Mild MR: Asymptomatic, continue to monitor.    Medication Adjustments/Labs and Tests Ordered: Current medicines are reviewed at length with the patient today.  Concerns regarding medicines are outlined above.  Medication changes, Labs and Tests ordered today are listed in the Patient Instructions below. Patient Instructions  Medication Instructions: RESTART the Losartan 25 mg tablet daily  If you need a refill on your cardiac medications before your next appointment, please call your pharmacy.   Labwork: Your provider would like for you to  return in two weeks to have the following labs drawn: BMET. You do not need an appointment for the lab. Once in our office lobby there is a podium where you can sign in and ring the doorbell to alert [place] that you are here. The lab is open from 8:00 am to 4:00 pm; closed for lunch from 12:45pm-1:45pm.   Follow-Up: Your physician wants you to follow-up in: 3-4 months with Dr. Ellyn Hack. You will receive a reminder letter in the mail two months in advance. If you don't receive a letter, please call our office at [phone] to schedule this follow-up appointment.   Special Instructions: Please follow up with a Hematologist.    Thank you for choosing Heartcare at [facility], [name], [place]  01/10/2018 2:28 PM    [address], [place], [place]  [address] Phone: [phone]; Fax: [phone]

## 2018-01-08 NOTE — Patient Instructions (Signed)
Medication Instructions: RESTART the Losartan 25 mg tablet daily  If you need a refill on your cardiac medications before your next appointment, please call your pharmacy.   Labwork: Your provider would like for you to return in two weeks to have the following labs drawn: BMET. You do not need an appointment for the lab. Once in our office lobby there is a podium where you can sign in and ring the doorbell to alert [place] that you are here. The lab is open from 8:00 am to 4:00 pm; closed for lunch from 12:45pm-1:45pm.   Follow-Up: Your physician wants you to follow-up in: 3-4 months with Dr. Ellyn Hack. You will receive a reminder letter in the mail two months in advance. If you don't receive a letter, please call our office at [phone] to schedule this follow-up appointment.   Special Instructions: Please follow up with a Hematologist.    Thank you for choosing Heartcare at [facility]!!

## 2018-01-10 ENCOUNTER — Encounter: Payer: Self-pay | Admitting: Physician Assistant

## 2018-04-15 ENCOUNTER — Ambulatory Visit: Payer: Medicare Other | Admitting: Cardiology

## 2018-04-15 ENCOUNTER — Encounter: Payer: Self-pay | Admitting: Cardiology

## 2018-04-15 VITALS — BP 138/62 | HR 67 | Ht 62.0 in | Wt 153.6 lb

## 2018-04-15 DIAGNOSIS — I340 Nonrheumatic mitral (valve) insufficiency: Secondary | ICD-10-CM | POA: Diagnosis not present

## 2018-04-15 DIAGNOSIS — I700 Atherosclerosis of aorta: Secondary | ICD-10-CM | POA: Diagnosis not present

## 2018-04-15 DIAGNOSIS — E782 Mixed hyperlipidemia: Secondary | ICD-10-CM

## 2018-04-15 DIAGNOSIS — I10 Essential (primary) hypertension: Secondary | ICD-10-CM

## 2018-04-15 DIAGNOSIS — R011 Cardiac murmur, unspecified: Secondary | ICD-10-CM

## 2018-04-15 NOTE — Patient Instructions (Signed)
MEDICATION INSTRUCTION NO CHANGES    Your physician wants you to follow-up in [place].  You will receive a reminder letter in the mail two months in advance. If you don't receive a letter, please call our office to schedule the follow-up appointment.   If you need a refill on your cardiac medications before your next appointment, please call your pharmacy.

## 2018-04-15 NOTE — Progress Notes (Signed)
PCP: Heather Ruff, MD  Heme-Onc Dr. Marin Knapp    Clinic Note: Chief Complaint  Patient presents with  . Follow-up    murmur; htn; No Symptoms    HPI: Heather Knapp is a 84 y.o. female with a PMH below who presents today for 62-monthfollow-up. PMH of CLL, L breast CA, HTN, HLD and mitral valve disease.  Patient first seen by me in December 2017 for severe mitral annular calcification was possible posterior leaflet prolapse with trivial mitral regurgitation was seen on echocardiogram.  BShana Chutewas last seen on January 08, 2018 by HAlmyra Deforest PA after her 2 D Echo-- "She is quite active at home and able to climb up at least 2 flight of stairs or walk 2 blocks away from her home and back without any issue. She does not have any exertional chest discomfort or shortness of breath."  She stopped taking the losartan that I prescribed during her initial visit.  Mr. MEulas Postdecided to re-start the Losartan for elevated BP.  Losartan recent Hospitalizations: n/a  Studies Personally Reviewed - (if available, images/films reviewed: From Epic Chart or Care Everywhere)  Transthoracic Echo January 2019: EF 60-65%.  GR 1 DD severe MAC with mild MR.  Elevated PA pressures. Also noted Aortic Sclerosis w/o stenosis  Interval History: [name] a very pleasant 84year old appears quite healthy.  She really does not have any notable symptoms from a cardiac standpoint.  She still is quite active doing everything she would like to do as far as activities of daily living including climbing up stairs and going for routine walks.  No resting or exertional chest tightness /pressure or dyspnea.  No PND, orthopnea or edema. No palpitations, lightheadedness, dizziness, weakness or syncope/near syncope. No TIA/amaurosis fugax symptoms. No claudication.  ROS: A comprehensive was performed. Review of Systems  Constitutional: Negative for malaise/fatigue.  HENT: Negative for nosebleeds.   Respiratory:  Negative for shortness of breath.   Gastrointestinal: Negative for blood in stool and constipation.  Genitourinary: Negative for hematuria.  Musculoskeletal: Positive for joint pain (Just mild baseline arthralgias).  Neurological: Negative for dizziness, focal weakness and weakness.  Endo/Heme/Allergies: Does not bruise/bleed easily.  Psychiatric/Behavioral: The patient is not nervous/anxious.   All other systems reviewed and are negative.   I have reviewed and (if needed) personally updated the patient's problem list, medications, allergies, past medical and surgical history, social and family history.   Past Medical History:  Diagnosis Date  . [facility] [place]   . History of left breast cancer   . Hyperlipidemia   . Hypertension     Past Surgical History:  Procedure Laterality Date  . MASTECTOMY  1998  . TRANSTHORACIC ECHOCARDIOGRAM  09/25/2016   Severe mitral annular calcification with possible posterior leaflet prolapse with trivial MR.  . TRANSTHORACIC ECHOCARDIOGRAM  12/2017   EF 60-65%.  GR 1 DD severe MAC with mild MR.  Elevated PA pressures.  Aortic Sclerosis w/o stenosis    Current Meds  Medication Sig  . Fish Oil-Cholecalciferol (FISH OIL + D3 PO) Take 1 tablet by mouth every morning.   . Probiotic Product (ALIGN PO) Take 1 tablet by mouth daily after breakfast.   . TOPROL XL 100 MG 24 hr tablet Take 100 mg by mouth Daily.    No Known Allergies  Social History   Tobacco Use  . Smoking status: Never Smoker  . Smokeless tobacco: Never Used  . Tobacco comment: never used  tobacco  Substance Use Topics  . Alcohol use: Yes    Alcohol/week: 0.0 oz    Comment: 8oz glass of wine daily  . Drug use: No   Social History   Social History Narrative   Lives at home alone.   Right-handed.   4 cups caffeine daily.    family history includes Hypertension in her father.  Wt Readings from Last 3 Encounters:  04/15/18 153 lb 9.6 oz (69.7 kg)    01/08/18 152 lb (68.9 kg)  12/11/16 146 lb 12 oz (66.6 kg)    PHYSICAL EXAM BP 138/62   Pulse 67   Ht 5' 2"  (1.575 m)   Wt 153 lb 9.6 oz (69.7 kg)   BMI 28.09 kg/m  Physical Exam  Constitutional: She is oriented to person, place, and time. She appears well-developed and well-nourished. No distress.  Healthy-appearing.  Well-groomed  HENT:  Head: Normocephalic and atraumatic.  Neck: No hepatojugular reflux and no JVD present. Carotid bruit is not present (Radiated aortic valve murmur).  Cardiovascular: Normal rate, regular rhythm and intact distal pulses.  No extrasystoles are present. PMI is not displaced. Exam reveals no gallop and no friction rub.  Murmur heard.  Harsh early systolic murmur is present with a grade of 2/6 at the upper right sternal border radiating to the neck. Pulmonary/Chest: Effort normal and breath sounds normal. No respiratory distress. She has no wheezes. She has no rales.  Abdominal: Soft. Bowel sounds are normal. She exhibits no distension. There is no tenderness. There is no rebound.  Musculoskeletal: Normal range of motion. She exhibits no edema.  Neurological: She is alert and oriented to person, place, and time.  Psychiatric: She has a normal mood and affect. Her behavior is normal. Judgment and thought content normal.    Adult ECG Report n/a  Other studies Reviewed: Additional studies/ records that were reviewed today include:  Recent Labs:   Lab Results  Component Value Date   CHOL 210 (H) 04/07/2008   HDL 46 04/07/2008   LDLCALC 143 (H) 04/07/2008   TRIG 104 04/07/2008   CHOLHDL 4.6 04/07/2008    BUN/creatinine from October 2018 was 17/0.76.  ASSESSMENT / PLAN: Problem List Items Addressed This Visit    Systolic ejection murmur (Chronic)    Not described as such on echo report, but read out verbage would indicate aortic sclerosis but no stenosis.  This would explain a systolic ejection murmur here      Mild mitral regurgitation     She does have significant mitral annular calcification on echo, but no significant MR.  Provide her blood pressures controlled, this should be fine. Plan: Continue beta-blocker and recently restarted ARB.  Blood pressure looks okay today. Okay to recheck an echo in 2 years       Hyperlipidemia (Chronic)    Not at goal.  Would recommend considering therapy, will defer to PCP.      Essential hypertension - Primary (Chronic)    Better blood pressure today.  Okay to continue combination of ARB and beta-blocker.      Aortic atherosclerosis (HCC)    Target LDL is less than 100, if not closer to 70.  Most recent check was not at goal.  Low threshold to consider treatment with statin.  Labs currently being monitored by PCP -- will defer decision about treating to PCP.         Current medicines are reviewed at length with the patient today.  (+/- concerns) doing OK with  Losartan The following changes have been made:  n/a  Patient Instructions  MEDICATION INSTRUCTION NO CHANGES    Your physician wants you to follow-up in [place].  You will receive a reminder letter in the mail two months in advance. If you don't receive a letter, please call our office to schedule the follow-up appointment.   If you need a refill on your cardiac medications before your next appointment, please call your pharmacy.     Studies Ordered:   No orders of the defined types were placed in this encounter.     Glenetta Hew, HeatherD., HeatherS. Interventional Cardiologist   Pager # [phone] Phone # [phone] [address] [place], [place] [address]   Thank you for choosing Heartcare at [facility]!!

## 2018-04-18 ENCOUNTER — Encounter: Payer: Self-pay | Admitting: Cardiology

## 2018-04-18 NOTE — Assessment & Plan Note (Signed)
She does have significant mitral annular calcification on echo, but no significant MR.  Provide her blood pressures controlled, this should be fine. Plan: Continue beta-blocker and recently restarted ARB.  Blood pressure looks okay today. Okay to recheck an echo in 2 years

## 2018-04-19 DIAGNOSIS — R011 Cardiac murmur, unspecified: Secondary | ICD-10-CM | POA: Insufficient documentation

## 2018-04-19 NOTE — Assessment & Plan Note (Signed)
Target LDL is less than 100, if not closer to 70.  Most recent check was not at goal.  Low threshold to consider treatment with statin.  Labs currently being monitored by PCP -- will defer decision about treating to PCP.

## 2018-04-19 NOTE — Assessment & Plan Note (Signed)
Not described as such on echo report, but read out verbage would indicate aortic sclerosis but no stenosis.  This would explain a systolic ejection murmur here

## 2018-04-19 NOTE — Assessment & Plan Note (Signed)
Not at goal.  Would recommend considering therapy, will defer to PCP.

## 2018-04-19 NOTE — Assessment & Plan Note (Signed)
Better blood pressure today.  Okay to continue combination of ARB and beta-blocker.

## 2018-06-24 ENCOUNTER — Other Ambulatory Visit: Payer: Self-pay

## 2018-06-24 DIAGNOSIS — E559 Vitamin D deficiency, unspecified: Secondary | ICD-10-CM

## 2018-06-24 DIAGNOSIS — C9110 Chronic lymphocytic leukemia of B-cell type not having achieved remission: Secondary | ICD-10-CM

## 2018-06-25 ENCOUNTER — Inpatient Hospital Stay: Payer: Medicare Other | Attending: Hematology & Oncology | Admitting: Hematology & Oncology

## 2018-06-25 ENCOUNTER — Encounter: Payer: Self-pay | Admitting: Hematology & Oncology

## 2018-06-25 ENCOUNTER — Other Ambulatory Visit: Payer: Self-pay

## 2018-06-25 ENCOUNTER — Inpatient Hospital Stay: Payer: Medicare Other

## 2018-06-25 VITALS — BP 155/69 | HR 65 | Temp 98.3°F | Resp 18 | Wt 149.0 lb

## 2018-06-25 DIAGNOSIS — Z853 Personal history of malignant neoplasm of breast: Secondary | ICD-10-CM | POA: Insufficient documentation

## 2018-06-25 DIAGNOSIS — Z79899 Other long term (current) drug therapy: Secondary | ICD-10-CM | POA: Insufficient documentation

## 2018-06-25 DIAGNOSIS — C9110 Chronic lymphocytic leukemia of B-cell type not having achieved remission: Secondary | ICD-10-CM

## 2018-06-25 DIAGNOSIS — E559 Vitamin D deficiency, unspecified: Secondary | ICD-10-CM

## 2018-06-25 LAB — CBC WITH DIFFERENTIAL (CANCER CENTER ONLY)
BAND NEUTROPHILS: 0 %
BASOS ABS: 0 10*3/uL (ref 0.0–0.1)
BASOS PCT: 0 %
BLASTS: 0 %
EOS ABS: 0 10*3/uL (ref 0.0–0.5)
Eosinophils Relative: 0 %
HEMATOCRIT: 42.1 % (ref 34.8–46.6)
Hemoglobin: 14.3 g/dL (ref 11.6–15.9)
LYMPHS PCT: 86 %
Lymphs Abs: 27.2 10*3/uL — ABNORMAL HIGH (ref 0.9–3.3)
MCH: 32.8 pg (ref 26.0–34.0)
MCHC: 34 g/dL (ref 32.0–36.0)
MCV: 96.6 fL (ref 81.0–101.0)
METAMYELOCYTES PCT: 0 %
MONO ABS: 1.3 10*3/uL — AB (ref 0.1–0.9)
Monocytes Relative: 4 %
Myelocytes: 0 %
NEUTROS ABS: 3.2 10*3/uL (ref 1.5–6.5)
Neutrophils Relative %: 10 %
OTHER: 0 %
PLATELETS: 114 10*3/uL — AB (ref 145–400)
PROMYELOCYTES RELATIVE: 0 %
RBC: 4.36 MIL/uL (ref 3.70–5.32)
RDW: 13.3 % (ref 11.1–15.7)
WBC Count: 31.7 10*3/uL — ABNORMAL HIGH (ref 3.9–10.0)
nRBC: 0 /100 WBC

## 2018-06-25 LAB — CMP (CANCER CENTER ONLY)
ALT: 15 U/L (ref 0–44)
AST: 27 U/L (ref 15–41)
Albumin: 4.6 g/dL (ref 3.5–5.0)
Alkaline Phosphatase: 53 U/L (ref 38–126)
Anion gap: 9 (ref 5–15)
BUN: 12 mg/dL (ref 8–23)
CHLORIDE: 101 mmol/L (ref 98–111)
CO2: 25 mmol/L (ref 22–32)
Calcium: 9.6 mg/dL (ref 8.9–10.3)
Creatinine: 0.78 mg/dL (ref 0.44–1.00)
Glucose, Bld: 93 mg/dL (ref 70–99)
POTASSIUM: 4.9 mmol/L (ref 3.5–5.1)
SODIUM: 135 mmol/L (ref 135–145)
Total Bilirubin: 0.8 mg/dL (ref 0.3–1.2)
Total Protein: 6.4 g/dL — ABNORMAL LOW (ref 6.5–8.1)

## 2018-06-25 LAB — RETICULOCYTES
RBC.: 4.35 MIL/uL (ref 3.70–5.45)
RETIC COUNT ABSOLUTE: 87 10*3/uL (ref 33.7–90.7)
Retic Ct Pct: 2 % (ref 0.7–2.1)

## 2018-06-25 LAB — LACTATE DEHYDROGENASE: LDH: 229 U/L — AB (ref 98–192)
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
## 2018-06-26 LAB — VITAMIN D 25 HYDROXY (VIT D DEFICIENCY, FRACTURES): VIT D 25 HYDROXY: 71.4 ng/mL (ref 30.0–100.0)

## 2019-03-22 DIAGNOSIS — J029 Acute pharyngitis, unspecified: Secondary | ICD-10-CM | POA: Diagnosis not present

## 2019-03-22 DIAGNOSIS — R413 Other amnesia: Secondary | ICD-10-CM | POA: Diagnosis not present

## 2019-04-05 ENCOUNTER — Telehealth: Payer: Self-pay | Admitting: *Deleted

## 2019-04-05 NOTE — Telephone Encounter (Signed)
04/05/19 LMOM @ 3:16, re: follow up appointment.

## 2019-05-05 ENCOUNTER — Telehealth: Payer: Self-pay | Admitting: *Deleted

## 2019-05-05 ENCOUNTER — Telehealth (INDEPENDENT_AMBULATORY_CARE_PROVIDER_SITE_OTHER): Payer: Medicare HMO | Admitting: Cardiology

## 2019-05-05 VITALS — BP 138/68 | HR 67 | Ht 62.0 in | Wt 150.0 lb

## 2019-05-05 DIAGNOSIS — I340 Nonrheumatic mitral (valve) insufficiency: Secondary | ICD-10-CM | POA: Diagnosis not present

## 2019-05-05 DIAGNOSIS — E782 Mixed hyperlipidemia: Secondary | ICD-10-CM

## 2019-05-05 DIAGNOSIS — I10 Essential (primary) hypertension: Secondary | ICD-10-CM

## 2019-05-05 NOTE — Patient Instructions (Addendum)
Medication Instructions:   -- no changes   If you need a refill on your cardiac medications before your next appointment, please call your pharmacy.   Lab work:  If PCP considers starting cholesterol medication after labs are checked - I would be in agreement.  If you have labs (blood work) drawn today and your tests are completely normal, you will receive your results only by: Marland Kitchen MyChart Message (if you have MyChart) OR . A paper copy in the mail If you have any lab test that is abnormal or we need to change your treatment, we will call you to review the results.  Testing/Procedures: None  Follow-Up: At [facility], you and your health needs are our priority.  As part of our continuing mission to provide you with exceptional heart care, we have created designated Provider Care Teams.  These Care Teams include your primary Cardiologist (physician) and Advanced Practice Providers (APPs -  Physician Assistants and Nurse Practitioners) who all work together to provide you with the care you need, when you need it. . You will need a follow up appointment in 12 months- MAY 2021.  Please call our office 2 months in advance to schedule this appointment.  You may see Glenetta Hew, MD or one of the following Advanced Practice Providers on your designated Care Team:   . Rosaria Ferries, PA-C . Jory Sims, DNP, ANP  Any Other Special Instructions Will Be Listed Below (If Applicable).

## 2019-05-05 NOTE — Telephone Encounter (Signed)
Left detailed message on daughter's voicemail- of instruction from 5/27 televisit - avs summary will be sent via mychart

## 2019-05-05 NOTE — Progress Notes (Signed)
Virtual Visit via Video Note   This visit type was conducted due to national recommendations for restrictions regarding the COVID-19 Pandemic (e.g. social distancing) in an effort to limit this patient's exposure and mitigate transmission in our community.  Due to her co-morbid illnesses, this patient is at least at moderate risk for complications without adequate follow up.  This format is felt to be most appropriate for this patient at this time.  All issues noted in this document were discussed and addressed.  A limited physical exam was performed with this format.  Please refer to the patient's chart for her consent to telehealth for [facility].   Patient has given verbal permission to conduct this visit via virtual appointment and to bill insurance 05/08/2019 3:33 PM     Evaluation Performed:  Follow-up visit  Date:  05/08/2019   ID:  Heather Knapp, DOB 07-20-1934, MRN [id number]  Patient Location: Home Provider Location: Office  PCP:  Leighton Ruff, MD  Cardiologist:  Glenetta Hew, MD  Electrophysiologist:  None   Chief Complaint: Annual follow-up  History of Present Illness:    Heather Knapp is a 85 y.o. female with PMH notable for hypertension, hyperlipidemia as well as CLL and left breast cancer who presents via audio/video conferencing for a telehealth visit today.  Heather Knapp was last seen in May 2019.  Appeared quite healthy and was relatively stable from a cardiac standpoint.  Still very active climbing stairs, routine walks and ADLs.  Mild arthralgias but no other significant symptoms.  Deferred treatment of lipids to PCP.  Blood pressure stable.  Plan was to recheck echo in 2 years for MR.  Interval History:  Heather Knapp is doing well. Exercises ~30 min daily (exercise machines - glider) also walks a few days a week outside (good long walk). May get a bit winded going hills.   Occasionally feels "unsteady on feet" - a bit off balance.  Cardiovascular ROS: no  chest pain or dyspnea on exertion negative for - edema, irregular heartbeat, loss of consciousness, orthopnea, palpitations, paroxysmal nocturnal dyspnea, rapid heart rate, shortness of breath or syncope/ near syncope, TIA/ amaurosis, claudication  The patient does not have symptoms concerning for COVID-19 infection (fever, chills, cough, or new shortness of breath).  The patient is practicing social distancing.  Family helping with groceries.   ROS:  Please see the history of present illness.    Review of Systems  Constitutional: Negative for malaise/fatigue and weight loss.  HENT: Negative for congestion and nosebleeds.   Respiratory: Negative for shortness of breath.   Cardiovascular: Negative for claudication and leg swelling.  Gastrointestinal: Negative for blood in stool and melena.  Genitourinary: Negative for hematuria.  Musculoskeletal: Negative for falls and joint pain.  Neurological: Positive for dizziness (per HPI). Negative for weakness and headaches.  Psychiatric/Behavioral: Negative.     Past Medical History:  Diagnosis Date  . CLL (chronic lymphocytic leukemia) ([place])   . History of left breast cancer   . Hyperlipidemia   . Hypertension    Past Surgical History:  Procedure Laterality Date  . MASTECTOMY  1998  . TRANSTHORACIC ECHOCARDIOGRAM  09/25/2016   Severe mitral annular calcification with possible posterior leaflet prolapse with trivial MR.  . TRANSTHORACIC ECHOCARDIOGRAM  12/2017   EF 60-65%.  GR 1 DD severe MAC with mild MR.  Elevated PA pressures.  Aortic Sclerosis w/o stenosis     Current Meds  Medication Sig  . Fish Oil-Cholecalciferol (FISH OIL +  D3 PO) Take 1 tablet by mouth every morning.   Marland Kitchen losartan (COZAAR) 25 MG tablet Take 1 tablet (25 mg total) by mouth daily.  . Probiotic Product (ALIGN PO) Take 1 tablet by mouth daily after breakfast.   . TOPROL XL 100 MG 24 hr tablet Take 100 mg by mouth Daily.   Allergies:   Patient has no known  allergies.   Social History   Tobacco Use  . Smoking status: Never Smoker  . Smokeless tobacco: Never Used  . Tobacco comment: never used tobacco  Substance Use Topics  . Alcohol use: Yes    Alcohol/week: 0.0 standard drinks    Comment: 8oz glass of wine daily  . Drug use: No     Family Hx: The patient's family history includes Hypertension in her father.   Prior CV studies:   The following studies were reviewed today: . none:  Labs/Other Tests and Data Reviewed:    EKG:  No ECG reviewed.  Recent Labs: 06/25/2018: ALT 15; BUN 12; Creatinine 0.78; Hemoglobin 14.3; Platelet Count 114; Potassium 4.9; Sodium 135   Recent Lipid Panel Lab Results  Component Value Date/Time   CHOL 210 (H) 04/07/2008 10:50 AM   TRIG 104 04/07/2008 10:50 AM   HDL 46 04/07/2008 10:50 AM   CHOLHDL 4.6 04/07/2008 10:50 AM   LDLCALC 143 (H) 04/07/2008 10:50 AM    Wt Readings from Last 3 Encounters:  05/05/19 150 lb (68 kg)  06/25/18 149 lb (67.6 kg)  04/15/18 153 lb 9.6 oz (69.7 kg)     Objective:    Vital Signs:  BP 138/68   Pulse 67   Ht _0  (1.575 m)   Wt 150 lb (68 kg)   BMI 27.44 kg/m   VITAL SIGNS:  reviewed GEN:  Well nourished, well developed female in no acute distress. RESPIRATORY:  normal respiratory effort, symmetric expansion CARDIOVASCULAR:  no peripheral edema MUSCULOSKELETAL:  no obvious deformities. NEURO:  alert and oriented x 3, no obvious focal deficit PSYCH:  normal affect   ASSESSMENT & PLAN:    Problem List Items Addressed This Visit    Essential hypertension (Chronic)    Blood pressure borderline elevated today, but still relatively well controlled. Continue current dose of Toprol and losartan.      Hyperlipidemia (Chronic)    Due for labs to be rechecked soon.  Based on last year's labs, she probably is not going to be at goal.  Would likely warrant management, however will defer to PCP will be seeing her more frequently.      Mild mitral  regurgitation - Primary (Chronic)    I suspect this may be related somewhat to blood pressure.  No evidence of significant valvular prolapse etc. We can discuss whether or not we recheck an echocardiogram after next year's follow-up depending on what I hear on exam.  If the murmur is still is relatively soft, would probably not reassess.         COVID-19 Education: The signs and symptoms of COVID-19 were discussed with the patient and how to seek care for testing (follow up with PCP or arrange E-visit).   The importance of social distancing was discussed today.  Time:   Today, I have spent 18 minutes with the patient with telehealth technology discussing the above problems.     Medication Adjustments/Labs and Tests Ordered: Current medicines are reviewed at length with the patient today.  Concerns regarding medicines are outlined above.  Medication Instructions:   --  no changes  Tests Ordered: No orders of the defined types were placed in this encounter.   Medication Changes: No orders of the defined types were placed in this encounter.   Disposition:  Follow up in 1 year(s)    Signed, Glenetta Hew, MD  05/08/2019 3:33 PM    [place]

## 2019-05-08 ENCOUNTER — Encounter: Payer: Self-pay | Admitting: Cardiology

## 2019-05-08 NOTE — Assessment & Plan Note (Signed)
I suspect this may be related somewhat to blood pressure.  No evidence of significant valvular prolapse etc. We can discuss whether or not we recheck an echocardiogram after next year's follow-up depending on what I hear on exam.  If the murmur is still is relatively soft, would probably not reassess.

## 2019-05-08 NOTE — Assessment & Plan Note (Signed)
Blood pressure borderline elevated today, but still relatively well controlled. Continue current dose of Toprol and losartan.

## 2019-05-08 NOTE — Assessment & Plan Note (Signed)
Due for labs to be rechecked soon.  Based on last year's labs, she probably is not going to be at goal.  Would likely warrant management, however will defer to PCP will be seeing her more frequently.

## 2019-05-16 DIAGNOSIS — I10 Essential (primary) hypertension: Secondary | ICD-10-CM | POA: Diagnosis not present

## 2019-05-16 DIAGNOSIS — C9110 Chronic lymphocytic leukemia of B-cell type not having achieved remission: Secondary | ICD-10-CM | POA: Diagnosis not present

## 2019-05-16 DIAGNOSIS — E7800 Pure hypercholesterolemia, unspecified: Secondary | ICD-10-CM | POA: Diagnosis not present

## 2019-05-16 DIAGNOSIS — R413 Other amnesia: Secondary | ICD-10-CM | POA: Diagnosis not present

## 2019-05-16 DIAGNOSIS — Z853 Personal history of malignant neoplasm of breast: Secondary | ICD-10-CM | POA: Diagnosis not present

## 2019-05-24 ENCOUNTER — Encounter: Payer: Self-pay | Admitting: *Deleted

## 2019-05-24 ENCOUNTER — Telehealth: Payer: Self-pay | Admitting: *Deleted

## 2019-05-24 NOTE — Telephone Encounter (Signed)
She has a new patient appt on 05/26/2019 at 10am.  I was unable to reach her by phone to update her chart verbally.  It has been updated in Epic by using there referral paperwork received by her PCP Leighton Ruff, MD).

## 2019-05-26 ENCOUNTER — Encounter: Payer: Self-pay | Admitting: Neurology

## 2019-05-26 ENCOUNTER — Telehealth: Payer: Self-pay | Admitting: Neurology

## 2019-05-26 ENCOUNTER — Ambulatory Visit (INDEPENDENT_AMBULATORY_CARE_PROVIDER_SITE_OTHER): Payer: Medicare HMO | Admitting: Neurology

## 2019-05-26 ENCOUNTER — Other Ambulatory Visit: Payer: Self-pay

## 2019-05-26 DIAGNOSIS — R413 Other amnesia: Secondary | ICD-10-CM | POA: Diagnosis not present

## 2019-05-26 DIAGNOSIS — G3184 Mild cognitive impairment, so stated: Secondary | ICD-10-CM | POA: Diagnosis not present

## 2019-05-26 MED ORDER — MEMANTINE HCL 10 MG PO TABS: 10.0000 mg | ORAL_TABLET | Freq: Two times a day (BID) | ORAL | 11 refills | Status: DC
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
## 2019-05-26 NOTE — Telephone Encounter (Signed)
[employer] medicare order sent to [facility]. They will obtain the auth and reach out to the patient to schedule.  °

## 2019-05-30 ENCOUNTER — Institutional Professional Consult (permissible substitution): Payer: Self-pay | Admitting: Neurology

## 2019-06-02 ENCOUNTER — Telehealth: Payer: Self-pay | Admitting: Neurology

## 2019-06-02 NOTE — Telephone Encounter (Signed)
Noted  

## 2019-06-02 NOTE — Telephone Encounter (Signed)
I sent a mychart message to [name] informing her I send the order to [facility] and to give them a call at [phone].

## 2019-06-02 NOTE — Telephone Encounter (Signed)
Hi Dr. Krista Blue,    Eyota saw my mother, Heather Knapp, over video last week. You mentioned doing an MRI--can we please schedule one in the near future? I'd like to do all we can to treat her memory loss and prevent further cognitive decline, if at all possible.    Thank you!  [name]  [phone]

## 2019-06-17 ENCOUNTER — Other Ambulatory Visit: Payer: Self-pay | Admitting: Neurology

## 2019-06-20 NOTE — Telephone Encounter (Signed)
[employer] medicare Josem Kaufmann: [id number] (exp. 06/16/19 to 12/13/19) patient is scheduled at [facility] for 06/25/19.

## 2019-06-23 ENCOUNTER — Other Ambulatory Visit: Payer: Self-pay

## 2019-06-23 MED ORDER — LOSARTAN POTASSIUM 25 MG PO TABS: 25.0000 mg | ORAL_TABLET | Freq: Every day | ORAL | 3 refills | Status: DC

## 2019-06-24 ENCOUNTER — Inpatient Hospital Stay: Payer: Medicare HMO | Attending: Hematology & Oncology | Admitting: Hematology & Oncology

## 2019-06-24 ENCOUNTER — Inpatient Hospital Stay: Payer: Medicare HMO

## 2019-06-24 ENCOUNTER — Encounter: Payer: Self-pay | Admitting: Hematology & Oncology

## 2019-06-24 ENCOUNTER — Telehealth: Payer: Self-pay | Admitting: Hematology

## 2019-06-24 ENCOUNTER — Other Ambulatory Visit: Payer: Self-pay

## 2019-06-24 VITALS — BP 153/59 | HR 67 | Temp 98.8°F | Resp 16 | Wt 154.0 lb

## 2019-06-24 DIAGNOSIS — Z9012 Acquired absence of left breast and nipple: Secondary | ICD-10-CM | POA: Insufficient documentation

## 2019-06-24 DIAGNOSIS — Z853 Personal history of malignant neoplasm of breast: Secondary | ICD-10-CM | POA: Diagnosis not present

## 2019-06-24 DIAGNOSIS — C9110 Chronic lymphocytic leukemia of B-cell type not having achieved remission: Secondary | ICD-10-CM | POA: Diagnosis not present

## 2019-06-24 DIAGNOSIS — L821 Other seborrheic keratosis: Secondary | ICD-10-CM

## 2019-06-24 DIAGNOSIS — S32301A Unspecified fracture of right ilium, initial encounter for closed fracture: Secondary | ICD-10-CM

## 2019-06-24 LAB — CMP (CANCER CENTER ONLY)
ALT: 11 U/L (ref 0–44)
AST: 15 U/L (ref 15–41)
Albumin: 4.5 g/dL (ref 3.5–5.0)
Alkaline Phosphatase: 48 U/L (ref 38–126)
Anion gap: 8 (ref 5–15)
BUN: 13 mg/dL (ref 8–23)
CO2: 25 mmol/L (ref 22–32)
Calcium: 9 mg/dL (ref 8.9–10.3)
Chloride: 101 mmol/L (ref 98–111)
Creatinine: 0.69 mg/dL (ref 0.44–1.00)
GFR, Est AFR Am: >60 mL/min (ref >60)
GFR, Estimated: >60 mL/min (ref >60)
Glucose, Bld: 98 mg/dL (ref 70–99)
Potassium: 4.3 mmol/L (ref 3.5–5.1)
Sodium: 134 mmol/L — ABNORMAL LOW (ref 135–145)
Total Bilirubin: 0.7 mg/dL (ref 0.3–1.2)
Total Protein: 5.8 g/dL — ABNORMAL LOW (ref 6.5–8.1)

## 2019-06-24 LAB — CBC WITH DIFFERENTIAL (CANCER CENTER ONLY)
Abs Immature Granulocytes: 0.04 10*3/uL (ref 0.00–0.07)
Basophils Absolute: 0.1 10*3/uL (ref 0.0–0.1)
Basophils Relative: 0 %
Eosinophils Absolute: 0 10*3/uL (ref 0.0–0.5)
Eosinophils Relative: 0 %
HCT: 41.9 % (ref 36.0–46.0)
Hemoglobin: 14.2 g/dL (ref 12.0–15.0)
Immature Granulocytes: 0 %
Lymphocytes Relative: 82 %
Lymphs Abs: 18.9 10*3/uL — ABNORMAL HIGH (ref 0.7–4.0)
MCH: 32.7 pg (ref 26.0–34.0)
MCHC: 33.9 g/dL (ref 30.0–36.0)
MCV: 96.5 fL (ref 80.0–100.0)
Monocytes Absolute: 1.3 10*3/uL — ABNORMAL HIGH (ref 0.1–1.0)
Monocytes Relative: 6 %
Neutro Abs: 2.9 10*3/uL (ref 1.7–7.7)
Neutrophils Relative %: 12 %
Platelet Count: 100 10*3/uL — ABNORMAL LOW (ref 150–400)
RBC: 4.34 MIL/uL (ref 3.87–5.11)
RDW: 12.9 % (ref 11.5–15.5)
WBC Count: 23.2 10*3/uL — ABNORMAL HIGH (ref 4.0–10.5)
nRBC: 0 % (ref 0.0–0.2)

## 2019-06-24 LAB — SAVE SMEAR(SSMR), FOR PROVIDER SLIDE REVIEW
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
## 2019-06-24 NOTE — Telephone Encounter (Signed)
Appointments scheduled letter/calendar mailed per 7/17 los °

## 2019-06-25 ENCOUNTER — Ambulatory Visit
Admission: RE | Admit: 2019-06-25 | Discharge: 2019-06-25 | Disposition: A | Discharge status: Home / Self Care | Payer: Medicare HMO | Source: Ambulatory Visit | Attending: Neurology | Admitting: Neurology

## 2019-06-25 ENCOUNTER — Other Ambulatory Visit: Payer: Medicare HMO

## 2019-06-25 DIAGNOSIS — R413 Other amnesia: Secondary | ICD-10-CM | POA: Diagnosis not present

## 2019-06-27 LAB — LACTATE DEHYDROGENASE: LDH: 181 U/L (ref 98–192)

## 2019-08-20 DIAGNOSIS — R69 Illness, unspecified: Secondary | ICD-10-CM | POA: Diagnosis not present

## 2019-09-14 DIAGNOSIS — H2511 Age-related nuclear cataract, right eye: Secondary | ICD-10-CM | POA: Diagnosis not present

## 2019-09-14 DIAGNOSIS — H25043 Posterior subcapsular polar age-related cataract, bilateral: Secondary | ICD-10-CM | POA: Diagnosis not present

## 2019-09-14 DIAGNOSIS — I10 Essential (primary) hypertension: Secondary | ICD-10-CM | POA: Diagnosis not present

## 2019-09-14 DIAGNOSIS — H2513 Age-related nuclear cataract, bilateral: Secondary | ICD-10-CM | POA: Diagnosis not present

## 2019-09-14 DIAGNOSIS — H25013 Cortical age-related cataract, bilateral: Secondary | ICD-10-CM | POA: Diagnosis not present

## 2019-10-11 DIAGNOSIS — H25811 Combined forms of age-related cataract, right eye: Secondary | ICD-10-CM | POA: Diagnosis not present

## 2019-10-11 DIAGNOSIS — H2511 Age-related nuclear cataract, right eye: Secondary | ICD-10-CM | POA: Diagnosis not present

## 2019-10-12 DIAGNOSIS — H2512 Age-related nuclear cataract, left eye: Secondary | ICD-10-CM | POA: Diagnosis not present

## 2019-10-18 DIAGNOSIS — H25812 Combined forms of age-related cataract, left eye: Secondary | ICD-10-CM | POA: Diagnosis not present

## 2019-10-18 DIAGNOSIS — H2512 Age-related nuclear cataract, left eye: Secondary | ICD-10-CM | POA: Diagnosis not present

## 2019-11-01 DIAGNOSIS — M79605 Pain in left leg: Secondary | ICD-10-CM | POA: Diagnosis not present

## 2019-11-04 DIAGNOSIS — M7062 Trochanteric bursitis, left hip: Secondary | ICD-10-CM | POA: Diagnosis not present

## 2019-11-04 DIAGNOSIS — M79605 Pain in left leg: Secondary | ICD-10-CM | POA: Diagnosis not present

## 2019-11-14 DIAGNOSIS — M7062 Trochanteric bursitis, left hip: Secondary | ICD-10-CM | POA: Diagnosis not present

## 2019-11-14 DIAGNOSIS — M25562 Pain in left knee: Secondary | ICD-10-CM | POA: Diagnosis not present

## 2019-11-22 DIAGNOSIS — M25562 Pain in left knee: Secondary | ICD-10-CM | POA: Diagnosis not present

## 2019-11-22 DIAGNOSIS — M7062 Trochanteric bursitis, left hip: Secondary | ICD-10-CM | POA: Diagnosis not present

## 2019-12-16 ENCOUNTER — Telehealth: Payer: Self-pay | Admitting: Neurology

## 2019-12-16 MED ORDER — DONEPEZIL HCL 10 MG PO TABS: 10.0000 mg | ORAL_TABLET | Freq: Every day | ORAL | 11 refills | Status: DC

## 2019-12-16 NOTE — Telephone Encounter (Signed)
I have ERx aricept 10mg  daily add on to namenda 10mg  bid.

## 2020-01-03 ENCOUNTER — Ambulatory Visit: Payer: Medicare HMO

## 2020-01-12 ENCOUNTER — Ambulatory Visit: Payer: Medicare HMO | Attending: Internal Medicine

## 2020-01-12 DIAGNOSIS — Z23 Encounter for immunization: Secondary | ICD-10-CM | POA: Insufficient documentation

## 2020-01-12 NOTE — Progress Notes (Signed)
   Covid-19 Vaccination Clinic  Name:  ERLEEN LOCKMILLER    MRN: [id number] DOB: 06-29-1934  01/12/2020  Ms. Melillo was observed post Covid-19 immunization for 15 minutes without incidence. She was provided with Vaccine Information Sheet and instruction to access the V-Safe system.   Ms. Prior was instructed to call 911 with any severe reactions post vaccine: Marland Kitchen Difficulty breathing  . Swelling of your face and throat  . A fast heartbeat  . A bad rash all over your body  . Dizziness and weakness    Immunizations Administered    Name Date Dose VIS Date Route   Pfizer COVID-19 Vaccine 01/12/2020  3:48 PM 0.3 mL 11/18/2019 Intramuscular   Manufacturer: [place]   Lot: [id number]   [place]: [id number]

## 2020-01-14 ENCOUNTER — Ambulatory Visit: Payer: Medicare HMO

## 2020-02-07 ENCOUNTER — Ambulatory Visit: Payer: Medicare HMO | Attending: Internal Medicine

## 2020-02-07 DIAGNOSIS — Z23 Encounter for immunization: Secondary | ICD-10-CM

## 2020-02-07 NOTE — Progress Notes (Signed)
   Covid-19 Vaccination Clinic  Name:  Heather Knapp    MRN: [id number] DOB: [birth year]  02/07/2020  [name] was observed post Covid-19 immunization for 15 minutes without incident. She was provided with Vaccine Information Sheet and instruction to access the V-Safe system.   [name] was instructed to call 911 with any severe reactions post vaccine: Marland Kitchen Difficulty breathing  . Swelling of face and throat  . A fast heartbeat  . A bad rash all over body  . Dizziness and weakness   Immunizations Administered    Name Date Dose VIS Date Route   Pfizer COVID-19 Vaccine 02/07/2020 11:14 AM 0.3 mL 11/18/2019 Intramuscular   Manufacturer: [place]   Lot: [id number]   [place]: [id number]

## 2020-02-16 DIAGNOSIS — R69 Illness, unspecified: Secondary | ICD-10-CM | POA: Diagnosis not present

## 2020-03-06 ENCOUNTER — Encounter (HOSPITAL_COMMUNITY): Payer: Self-pay

## 2020-03-06 ENCOUNTER — Emergency Department (HOSPITAL_COMMUNITY): Payer: Medicare HMO

## 2020-03-06 ENCOUNTER — Inpatient Hospital Stay (HOSPITAL_COMMUNITY)
Admission: EM | Admit: 2020-03-06 | Discharge: 2020-03-12 | DRG: 183 | Disposition: A | Discharge status: Home / Self Care | Payer: Medicare HMO | Attending: Internal Medicine | Admitting: Internal Medicine

## 2020-03-06 ENCOUNTER — Other Ambulatory Visit: Payer: Self-pay

## 2020-03-06 DIAGNOSIS — Z8249 Family history of ischemic heart disease and other diseases of the circulatory system: Secondary | ICD-10-CM

## 2020-03-06 DIAGNOSIS — S2241XA Multiple fractures of ribs, right side, initial encounter for closed fracture: Secondary | ICD-10-CM | POA: Diagnosis not present

## 2020-03-06 DIAGNOSIS — F0390 Unspecified dementia without behavioral disturbance: Secondary | ICD-10-CM | POA: Diagnosis present

## 2020-03-06 DIAGNOSIS — C9110 Chronic lymphocytic leukemia of B-cell type not having achieved remission: Secondary | ICD-10-CM | POA: Diagnosis not present

## 2020-03-06 DIAGNOSIS — I10 Essential (primary) hypertension: Secondary | ICD-10-CM

## 2020-03-06 DIAGNOSIS — W010XXA Fall on same level from slipping, tripping and stumbling without subsequent striking against object, initial encounter: Secondary | ICD-10-CM | POA: Diagnosis present

## 2020-03-06 DIAGNOSIS — I110 Hypertensive heart disease with heart failure: Secondary | ICD-10-CM | POA: Diagnosis present

## 2020-03-06 DIAGNOSIS — R52 Pain, unspecified: Secondary | ICD-10-CM | POA: Diagnosis not present

## 2020-03-06 DIAGNOSIS — E871 Hypo-osmolality and hyponatremia: Secondary | ICD-10-CM | POA: Diagnosis present

## 2020-03-06 DIAGNOSIS — Z20822 Contact with and (suspected) exposure to covid-19: Secondary | ICD-10-CM | POA: Diagnosis not present

## 2020-03-06 DIAGNOSIS — Z9012 Acquired absence of left breast and nipple: Secondary | ICD-10-CM

## 2020-03-06 DIAGNOSIS — S2239XA Fracture of one rib, unspecified side, initial encounter for closed fracture: Secondary | ICD-10-CM | POA: Diagnosis present

## 2020-03-06 DIAGNOSIS — Z853 Personal history of malignant neoplasm of breast: Secondary | ICD-10-CM

## 2020-03-06 DIAGNOSIS — I5032 Chronic diastolic (congestive) heart failure: Secondary | ICD-10-CM | POA: Diagnosis present

## 2020-03-06 DIAGNOSIS — S2249XA Multiple fractures of ribs, unspecified side, initial encounter for closed fracture: Secondary | ICD-10-CM | POA: Diagnosis present

## 2020-03-06 DIAGNOSIS — D696 Thrombocytopenia, unspecified: Secondary | ICD-10-CM | POA: Diagnosis present

## 2020-03-06 DIAGNOSIS — J9601 Acute respiratory failure with hypoxia: Secondary | ICD-10-CM | POA: Diagnosis present

## 2020-03-06 DIAGNOSIS — F05 Delirium due to known physiological condition: Secondary | ICD-10-CM | POA: Diagnosis not present

## 2020-03-06 DIAGNOSIS — M25519 Pain in unspecified shoulder: Secondary | ICD-10-CM | POA: Diagnosis not present

## 2020-03-06 DIAGNOSIS — Z79899 Other long term (current) drug therapy: Secondary | ICD-10-CM

## 2020-03-06 DIAGNOSIS — W19XXXA Unspecified fall, initial encounter: Secondary | ICD-10-CM | POA: Diagnosis not present

## 2020-03-06 LAB — COMPREHENSIVE METABOLIC PANEL
ALT: 22 U/L (ref 0–44)
AST: 26 U/L (ref 15–41)
Albumin: 4.3 g/dL (ref 3.5–5.0)
Alkaline Phosphatase: 36 U/L — ABNORMAL LOW (ref 38–126)
Anion gap: 11 (ref 5–15)
BUN: 22 mg/dL (ref 8–23)
CO2: 23 mmol/L (ref 22–32)
Calcium: 9.8 mg/dL (ref 8.9–10.3)
Chloride: 101 mmol/L (ref 98–111)
Creatinine, Ser: 0.73 mg/dL (ref 0.44–1.00)
GFR calc Af Amer: >60 mL/min (ref >60)
GFR calc non Af Amer: >60 mL/min (ref >60)
Glucose, Bld: 119 mg/dL — ABNORMAL HIGH (ref 70–99)
Potassium: 4.1 mmol/L (ref 3.5–5.1)
Sodium: 135 mmol/L (ref 135–145)
Total Bilirubin: 1 mg/dL (ref 0.3–1.2)
Total Protein: 6.3 g/dL — ABNORMAL LOW (ref 6.5–8.1)

## 2020-03-06 LAB — MAGNESIUM: Magnesium: 2.2 mg/dL (ref 1.7–2.4)

## 2020-03-06 LAB — CBC
HCT: 41.9 % (ref 36.0–46.0)
Hemoglobin: 14.2 g/dL (ref 12.0–15.0)
MCH: 34.1 pg — ABNORMAL HIGH (ref 26.0–34.0)
MCHC: 33.9 g/dL (ref 30.0–36.0)
MCV: 100.5 fL — ABNORMAL HIGH (ref 80.0–100.0)
Platelets: 103 10*3/uL — ABNORMAL LOW (ref 150–400)
RBC: 4.17 MIL/uL (ref 3.87–5.11)
RDW: 12.8 % (ref 11.5–15.5)
WBC: 20.6 10*3/uL — ABNORMAL HIGH (ref 4.0–10.5)
nRBC: 0.2 % (ref 0.0–0.2)

## 2020-03-06 MED ORDER — ONDANSETRON HCL 4 MG PO TABS
4.0000 mg | ORAL_TABLET | Freq: Four times a day (QID) | ORAL | Status: DC | PRN
  Administered 2020-03-08: 4 mg via ORAL
  Filled 2020-03-06: qty 1

## 2020-03-06 MED ORDER — DONEPEZIL HCL 10 MG PO TABS
10.0000 mg | ORAL_TABLET | Freq: Every day | ORAL | Status: DC
  Administered 2020-03-08 – 2020-03-11 (×5): 10 mg via ORAL
  Filled 2020-03-06: qty 2
  Filled 2020-03-06: qty 1
  Filled 2020-03-06: qty 2
  Filled 2020-03-06: qty 1
  Filled 2020-03-06 (×2): qty 2

## 2020-03-06 MED ORDER — METOPROLOL SUCCINATE ER 50 MG PO TB24
100.0000 mg | ORAL_TABLET | Freq: Every day | ORAL | Status: DC
  Administered 2020-03-07 – 2020-03-12 (×6): 100 mg via ORAL
  Filled 2020-03-06: qty 2
  Filled 2020-03-06 (×4): qty 1
  Filled 2020-03-06 (×2): qty 2

## 2020-03-06 MED ORDER — OXYCODONE-ACETAMINOPHEN 5-325 MG PO TABS
1.0000 | ORAL_TABLET | Freq: Once | ORAL | Status: AC
  Administered 2020-03-06: 1 via ORAL
  Filled 2020-03-06: qty 1

## 2020-03-06 MED ORDER — ALBUTEROL SULFATE (2.5 MG/3ML) 0.083% IN NEBU: 3.0000 mL | INHALATION_SOLUTION | RESPIRATORY_TRACT | Status: DC | PRN

## 2020-03-06 MED ORDER — SODIUM CHLORIDE 0.9% FLUSH
3.0000 mL | Freq: Two times a day (BID) | INTRAVENOUS | Status: DC
  Administered 2020-03-07 – 2020-03-09 (×3): 3 mL via INTRAVENOUS

## 2020-03-06 MED ORDER — MEMANTINE HCL 10 MG PO TABS
10.0000 mg | ORAL_TABLET | Freq: Two times a day (BID) | ORAL | Status: DC
  Administered 2020-03-07 – 2020-03-12 (×12): 10 mg via ORAL
  Filled 2020-03-06 (×12): qty 1

## 2020-03-06 MED ORDER — FENTANYL CITRATE (PF) 100 MCG/2ML IJ SOLN
25.0000 ug | Freq: Once | INTRAMUSCULAR | Status: AC
  Administered 2020-03-06: 25 ug via INTRAVENOUS
  Filled 2020-03-06: qty 2

## 2020-03-06 MED ORDER — LOSARTAN POTASSIUM 25 MG PO TABS
25.0000 mg | ORAL_TABLET | Freq: Every day | ORAL | Status: DC
  Administered 2020-03-07 – 2020-03-12 (×6): 25 mg via ORAL
  Filled 2020-03-06 (×6): qty 1

## 2020-03-06 MED ORDER — OXYCODONE-ACETAMINOPHEN 5-325 MG PO TABS
1.0000 | ORAL_TABLET | ORAL | Status: DC | PRN
  Administered 2020-03-07 – 2020-03-11 (×9): 1 via ORAL
  Filled 2020-03-06 (×10): qty 1

## 2020-03-06 MED ORDER — ADULT MULTIVITAMIN W/MINERALS CH
1.0000 | ORAL_TABLET | Freq: Every day | ORAL | Status: DC
  Administered 2020-03-07 – 2020-03-12 (×6): 1 via ORAL
  Filled 2020-03-06 (×6): qty 1

## 2020-03-06 MED ORDER — ONDANSETRON HCL 4 MG/2ML IJ SOLN: 4.0000 mg | Freq: Four times a day (QID) | INTRAMUSCULAR | Status: DC | PRN

## 2020-03-06 MED ORDER — OXYCODONE HCL 5 MG PO TABS
5.0000 mg | ORAL_TABLET | Freq: Once | ORAL | Status: AC
  Administered 2020-03-06: 5 mg via ORAL
  Filled 2020-03-06: qty 1

## 2020-03-06 NOTE — ED Triage Notes (Signed)
Pt presents with c/o unwitnessed fall that occurred at home earlier today. Pt hit her right shoulder blade and her back. Pt denies hitting her head, not on blood thinners. Pt is alert and oriented at this time.
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
## 2020-03-07 ENCOUNTER — Other Ambulatory Visit: Payer: Self-pay

## 2020-03-07 DIAGNOSIS — S2249XA Multiple fractures of ribs, unspecified side, initial encounter for closed fracture: Secondary | ICD-10-CM | POA: Diagnosis present

## 2020-03-07 DIAGNOSIS — S2239XA Fracture of one rib, unspecified side, initial encounter for closed fracture: Secondary | ICD-10-CM | POA: Diagnosis present

## 2020-03-07 DIAGNOSIS — S2241XA Multiple fractures of ribs, right side, initial encounter for closed fracture: Principal | ICD-10-CM

## 2020-03-07 LAB — CBC WITH DIFFERENTIAL/PLATELET
Abs Immature Granulocytes: 0.05 10*3/uL (ref 0.00–0.07)
Basophils Absolute: 0 10*3/uL (ref 0.0–0.1)
Basophils Relative: 0 %
Eosinophils Absolute: 0.2 10*3/uL (ref 0.0–0.5)
Eosinophils Relative: 1 %
HCT: 38.5 % (ref 36.0–46.0)
Hemoglobin: 12.6 g/dL (ref 12.0–15.0)
Immature Granulocytes: 0 %
Lymphocytes Relative: 68 %
Lymphs Abs: 12.2 10*3/uL — ABNORMAL HIGH (ref 0.7–4.0)
MCH: 32.9 pg (ref 26.0–34.0)
MCHC: 32.7 g/dL (ref 30.0–36.0)
MCV: 100.5 fL — ABNORMAL HIGH (ref 80.0–100.0)
Monocytes Absolute: 1.2 10*3/uL — ABNORMAL HIGH (ref 0.1–1.0)
Monocytes Relative: 6 %
Neutro Abs: 4.6 10*3/uL (ref 1.7–7.7)
Neutrophils Relative %: 25 %
Platelets: 106 10*3/uL — ABNORMAL LOW (ref 150–400)
RBC: 3.83 MIL/uL — ABNORMAL LOW (ref 3.87–5.11)
RDW: 12.8 % (ref 11.5–15.5)
WBC Morphology: ABNORMAL
WBC: 18.2 10*3/uL — ABNORMAL HIGH (ref 4.0–10.5)
nRBC: 0 % (ref 0.0–0.2)

## 2020-03-07 LAB — BASIC METABOLIC PANEL
Anion gap: 7 (ref 5–15)
BUN: 24 mg/dL — ABNORMAL HIGH (ref 8–23)
CO2: 27 mmol/L (ref 22–32)
Calcium: 9.3 mg/dL (ref 8.9–10.3)
Chloride: 101 mmol/L (ref 98–111)
Creatinine, Ser: 0.78 mg/dL (ref 0.44–1.00)
GFR calc Af Amer: >60 mL/min (ref >60)
GFR calc non Af Amer: >60 mL/min (ref >60)
Glucose, Bld: 135 mg/dL — ABNORMAL HIGH (ref 70–99)
Potassium: 4.8 mmol/L (ref 3.5–5.1)
Sodium: 135 mmol/L (ref 135–145)

## 2020-03-07 LAB — CBC
HCT: 39.4 % (ref 36.0–46.0)
Hemoglobin: 13.2 g/dL (ref 12.0–15.0)
MCH: 33.6 pg (ref 26.0–34.0)
MCHC: 33.5 g/dL (ref 30.0–36.0)
MCV: 100.3 fL — ABNORMAL HIGH (ref 80.0–100.0)
Platelets: 101 10*3/uL — ABNORMAL LOW (ref 150–400)
RBC: 3.93 MIL/uL (ref 3.87–5.11)
RDW: 13.1 % (ref 11.5–15.5)
WBC: 17 10*3/uL — ABNORMAL HIGH (ref 4.0–10.5)
nRBC: 0.2 % (ref 0.0–0.2)

## 2020-03-07 LAB — URINALYSIS, ROUTINE W REFLEX MICROSCOPIC
Bilirubin Urine: NEGATIVE
Glucose, UA: NEGATIVE mg/dL
Hgb urine dipstick: NEGATIVE
Ketones, ur: 5 mg/dL — AB
Leukocytes,Ua: NEGATIVE
Nitrite: NEGATIVE
Protein, ur: NEGATIVE mg/dL
Specific Gravity, Urine: 1.023 (ref 1.005–1.030)
pH: 6 (ref 5.0–8.0)

## 2020-03-07 LAB — CREATININE, SERUM
Creatinine, Ser: 0.72 mg/dL (ref 0.44–1.00)
GFR calc Af Amer: >60 mL/min (ref >60)
GFR calc non Af Amer: >60 mL/min (ref >60)

## 2020-03-07 LAB — MAGNESIUM: Magnesium: 2.1 mg/dL (ref 1.7–2.4)

## 2020-03-07 LAB — SARS CORONAVIRUS 2 (TAT 6-24 HRS): SARS Coronavirus 2: NEGATIVE

## 2020-03-07 MED ORDER — MORPHINE SULFATE (PF) 2 MG/ML IV SOLN
1.0000 mg | INTRAVENOUS | Status: DC | PRN
  Filled 2020-03-07: qty 1

## 2020-03-07 MED ORDER — ENOXAPARIN SODIUM 40 MG/0.4ML ~~LOC~~ SOLN
40.0000 mg | SUBCUTANEOUS | Status: DC
  Administered 2020-03-09 – 2020-03-11 (×3): 40 mg via SUBCUTANEOUS
  Filled 2020-03-07 (×3): qty 0.4

## 2020-03-07 MED ORDER — ACETAMINOPHEN 325 MG PO TABS
650.0000 mg | ORAL_TABLET | Freq: Three times a day (TID) | ORAL | Status: DC
  Administered 2020-03-07 – 2020-03-12 (×13): 650 mg via ORAL
  Filled 2020-03-07 (×14): qty 2

## 2020-03-07 MED ORDER — METHOCARBAMOL 500 MG PO TABS
500.0000 mg | ORAL_TABLET | Freq: Four times a day (QID) | ORAL | Status: DC | PRN
  Administered 2020-03-07 – 2020-03-12 (×8): 500 mg via ORAL
  Filled 2020-03-07 (×8): qty 1

## 2020-03-07 NOTE — Care Management CC44 (Signed)
Condition Code 44 Documentation Completed  Patient Details  Name: NYASHA WOOLF MRN: [id number] Date of Birth: 04-Jul-1934   Condition Code 44 given:  Yes Patient signature on Condition Code 44 notice:  Yes Documentation of 2 MD's agreement:  Yes Code 44 added to claim:  Yes    Lia Hopping, LCSW 03/07/2020, 4:50 PM

## 2020-03-07 NOTE — Evaluation (Signed)
Physical Therapy Evaluation Patient Details Name: Heather Knapp MRN: [id number] DOB: [birth year] Today's Date: 03/07/2020   History of Present Illness  Pt is 86 yo female with PMH including thrombocytopenia, osteopenia, memory loss, HTN, hyperlipidemia, breast CA, and CLL.  She was admitted s/p fall with multiple R rib fractures.  Clinical Impression  Pt admitted with above diagnosis. Pt presenting with decreased safety and requiring mod A and cues for ambulation and transfers.  Pt needing cues for transfer techniques and was limited due to pain.  Pt resides alone and would only have intermittent supervision if returning home.  Pt is fall risk and requires too much assistance to return home from PT perspective. Recommend SNF at d/c.  Pt currently with functional limitations due to the deficits listed below (see PT Problem List). Pt will benefit from skilled PT to increase their independence and safety with mobility to allow discharge to the venue listed below.       Follow Up Recommendations SNF    Equipment Recommendations  Rolling walker with 5" wheels;3in1 (PT)    Recommendations for Other Services       Precautions / Restrictions Precautions Precautions: Fall      Mobility  Bed Mobility Overal bed mobility: Needs Assistance Bed Mobility: Supine to Sit;Sit to Supine     Supine to sit: Mod assist;HOB elevated Sit to supine: Mod assist;HOB elevated   General bed mobility comments: cues for log roll technique to the left for decreased pain; required increased cues for sequencing; assist for trunk management with supine to sit; and to get legs onto bed with sit to supine  Transfers Overall transfer level: Needs assistance Equipment used: Rolling walker (2 wheeled) Transfers: Sit to/from Stand Sit to Stand: Min assist;From elevated surface         General transfer comment: performed x 2; cues for hand placement;  Ambulation/Gait Ambulation/Gait assistance: Min  assist Gait Distance (Feet): 3 Feet(x2) Assistive device: 1 person hand held assist Gait Pattern/deviations: Decreased stride length;Shuffle Gait velocity: decreased   General Gait Details: small steps to [facility] and back to bed; limited by pain; unsteady  Stairs            Wheelchair Mobility    Modified Rankin (Stroke Patients Only)       Balance Overall balance assessment: Needs assistance Sitting-balance support: Bilateral upper extremity supported;Feet supported Sitting balance-Leahy Scale: Fair     Standing balance support: Single extremity supported;During functional activity Standing balance-Leahy Scale: Fair Standing balance comment: required assist for balance with toielting adls                             Pertinent Vitals/Pain Pain Assessment: 0-10 Pain Score: (reports down to 6/10 post therapy) Pain Location: R ribs Pain Descriptors / Indicators: Discomfort Pain Intervention(s): Limited activity within patient's tolerance;Monitored during session;Relaxation;Repositioned    Home Living Family/patient expects to be discharged to:: Private residence Living Arrangements: Alone Available Help at Discharge: Family;Available PRN/intermittently(dtr lives hour away) Type of Home: House Home Access: Stairs to enter   Technical brewer of Steps: 1 Home Layout: Other (Comment)(1 step up to kitchen)        Prior Function Level of Independence: Independent         Comments: Pt independent with ADLs, IADLs, and driving.  Could ambulate in community.  Denies falls other than this fall- when she tripped and fell in [place]  Extremity/Trunk Assessment   Upper Extremity Assessment Upper Extremity Assessment: Overall WFL for tasks assessed    Lower Extremity Assessment Lower Extremity Assessment: Overall WFL for tasks assessed(Did not MMT due to rib pain.  Demonstrating at least 3/5 throughout UE and LE)     Cervical / Trunk Assessment Cervical / Trunk Assessment: Normal  Communication   Communication: No difficulties  Cognition Arousal/Alertness: Awake/alert Behavior During Therapy: WFL for tasks assessed/performed Overall Cognitive Status: No family/caregiver present to determine baseline cognitive functioning Area of Impairment: Following commands                 Orientation Level: Disoriented to;Time(only able to state year)   Memory: Decreased short-term memory Following Commands: Follows one step commands with increased time;Follows multi-step commands inconsistently       General Comments: Discussed decreased safety and recommendation for SNF with pt, she was agreeable.  Then 1-2 minutes later states "I just don't know how I can go home tomorrow."  Discussed again recommendation for SNF.  This happened 3 x.  Pt with short term memory but was able to follow commands with increased time.      General Comments General comments (skin integrity, edema, etc.): Discussed decreased safety and need for assistance with pt.  Discussed from therapy stand point unsafe to return home with HHPT - recommending SNF.  Pt aggreable.  Also, educated on importance of deep breaths and mobility to prevent PNE after rib fractures.    Exercises     Assessment/Plan    PT Assessment Patient needs continued PT services  PT Problem List Decreased strength;Decreased mobility;Decreased safety awareness;Decreased range of motion;Decreased knowledge of precautions;Decreased activity tolerance;Decreased cognition;Cardiopulmonary status limiting activity;Decreased balance;Decreased knowledge of use of DME;Pain       PT Treatment Interventions DME instruction;Therapeutic activities;Gait training;Modalities;Therapeutic exercise;Patient/family education;Balance training;Functional mobility training    PT Goals (Current goals can be found in the Care Plan section)  Acute Rehab PT Goals Patient Stated  Goal: decrease pain ; per chart family interested in home w [facility] ; after PT pt agreeable to SNF; notified case manager PT Goal Formulation: With patient Time For Goal Achievement: 03/21/20 Potential to Achieve Goals: Good    Frequency Min 3X/week   Barriers to discharge Decreased caregiver support      Co-evaluation               AM-PAC PT "6 Clicks" Mobility  Outcome Measure Help needed turning from your back to your side while in a flat bed without using bedrails?: A Lot Help needed moving from lying on your back to sitting on the side of a flat bed without using bedrails?: A Lot Help needed moving to and from a bed to a chair (including a wheelchair)?: A Little Help needed standing up from a chair using your arms (e.g., wheelchair or bedside chair)?: A Little Help needed to walk in hospital room?: A Lot Help needed climbing 3-5 steps with a railing? : A Lot 6 Click Score: 14    End of Session   Activity Tolerance: Patient limited by pain Patient left: in bed;with call bell/phone within reach;with bed alarm set Nurse Communication: Mobility status PT Visit Diagnosis: History of falling (Z91.81);Unsteadiness on feet (R26.81);Pain Pain - Right/Left: Right Pain - part of body: (ribs)    Time: [id number] PT Time Calculation (min) (ACUTE ONLY): 30 min   Charges:   PT Evaluation $PT Eval Moderate Complexity: 1 Mod PT Treatments $Therapeutic Activity: 8-22 mins  Maggie Font, PT Acute Rehab Services Pager [phone] [facility] [phone] [facility] [phone]   Karlton Lemon 03/07/2020, 4:41 PM

## 2020-03-07 NOTE — Care Management Obs Status (Signed)
[place] NOTIFICATION   Patient Details  Name: Heather Knapp MRN: [id number] Date of Birth: June 29, 1934   Medicare Observation Status Notification Given:  Yes    Lia Hopping, [place] 03/07/2020, 4:50 PM

## 2020-03-07 NOTE — Progress Notes (Signed)
PROGRESS NOTE    Heather Knapp    Code Status: Full Code  [id number] DOB: [birth year] DOA: 03/06/2020 LOS: 1 days  PCP: Leighton Ruff, MD CC:  Chief Complaint  Patient presents with  . [facility] Summary   This is an 86 year old female with history of chronic diastolic heart failure, CLL, hypertension, dementia admitted to [facility] on 3/30 following mechanical fall with right-sided back pain and subsequent right-sided rib fractures.  Admitted for pain management.  A & P   Principal Problem:   Multiple fractures of ribs, right side, initial encounter for closed fracture Active Problems:   CLL (chronic lymphocytic leukemia) (HCC)   Essential hypertension   Chronic diastolic CHF (congestive heart failure) (HCC)   Rib fractures   1. Right sided back pain/ribs 4 through 9 fracture status post mechanical fall standing height a. No pneumothorax on CXR b. Discussed with orthopedic surgery, no surgical intervention warranted c. Continue with pain management: Standing Tylenol, Percocet as needed and morphine added as well as Robaxin d. Strongly encourage incentive spirometry as rib fractures can lead to atelectasis and pneumonia e. PT recommending SNF 2. Mechanical fall from standing height a. PT recommending SNF 3. CLL, Stable 4. Compensated HFpEF a. I/O, daily weights 5. Hypertension a. Continue Toprol-XL and losartan 6. Dementia a. Lives alone b. Continue Aricept  DVT prophylaxis: Lovenox, SCDs Family Communication: No family at bedside Disposition Plan:   Patient came from:   Home                                                                                          Anticipated d/c place:SNF  Barriers to d/c: Pain management, SNF  Pressure injury documentation    None  Consultants  None  Procedures  None  Antibiotics   Anti-infectives (From admission, onward)   None        Subjective   Examined at bedside.  Patient admits to poor pain  control with Percocet.  Unable to take deep breaths.  Denies any chest pain, nausea or vomiting.  Admits to right-sided back pain.  No other complaints  Objective   Vitals:   03/07/20 0030 03/07/20 0528 03/07/20 1027 03/07/20 1338  BP: 140/64 (!) 143/73 (!) 134/54 (!) 145/58  Pulse: 63 77 65 71  Resp: 18 18  18   Temp: 98.1 F (36.7 C) 97.9 F (36.6 C)  (!) 97.2 F (36.2 C)  TempSrc:  Oral  Oral  SpO2: 97% 96%  94%    Intake/Output Summary (Last 24 hours) at 03/07/2020 1711 Last data filed at 03/07/2020 1400 Gross per 24 hour  Intake 780 ml  Output 600 ml  Net 180 ml   There were no vitals filed for this visit.  Examination:  Physical Exam Vitals and nursing note reviewed.  Constitutional:      Comments: Appears uncomfortable and in pain  HENT:     Head: Normocephalic.     Mouth/Throat:     Mouth: Mucous membranes are moist.  Eyes:     Conjunctiva/sclera: Conjunctivae normal.  Cardiovascular:     Rate and Rhythm: Normal  rate and regular rhythm.  Pulmonary:     Effort: No respiratory distress.     Comments: Poor inspiratory effort Abdominal:     General: Abdomen is flat. There is no distension.  Musculoskeletal:     Comments: Pain of right back with sudden movements  Neurological:     Mental Status: She is alert. Mental status is at baseline.  Psychiatric:        Mood and Affect: Mood normal.        Behavior: Behavior normal.     Data Reviewed: I have personally reviewed following labs and imaging studies  CBC: Recent Labs  Lab 03/06/20 2139 03/07/20 0449  WBC 20.6* 18.2*  NEUTROABS  --  4.6  HGB 14.2 12.6  HCT 41.9 38.5  MCV 100.5* 100.5*  PLT 103* [id number]*   Basic Metabolic Panel: Recent Labs  Lab 03/06/20 2139 03/07/20 0449  NA 135 135  K 4.1 4.8  CL 101 101  CO2 23 27  GLUCOSE 119* 135*  BUN 22 24*  CREATININE 0.73 0.78  CALCIUM 9.8 9.3  MG 2.2 2.1   GFR: CrCl cannot be calculated (Unknown ideal weight.). Liver Function Tests: Recent  Labs  Lab 03/06/20 2139  AST 26  ALT 22  ALKPHOS 36*  BILITOT 1.0  PROT 6.3*  ALBUMIN 4.3   No results for input(s): LIPASE, AMYLASE in the last 168 hours. No results for input(s): AMMONIA in the last 168 hours. Coagulation Profile: No results for input(s): INR, PROTIME in the last 168 hours. Cardiac Enzymes: No results for input(s): CKTOTAL, CKMB, CKMBINDEX, TROPONINI in the last 168 hours. BNP (last 3 results) No results for input(s): PROBNP in the last 8760 hours. HbA1C: No results for input(s): HGBA1C in the last 72 hours. CBG: No results for input(s): GLUCAP in the last 168 hours. Lipid Profile: No results for input(s): CHOL, HDL, LDLCALC, TRIG, CHOLHDL, LDLDIRECT in the last 72 hours. Thyroid Function Tests: No results for input(s): TSH, T4TOTAL, FREET4, T3FREE, THYROIDAB in the last 72 hours. Anemia Panel: No results for input(s): VITAMINB12, FOLATE, FERRITIN, TIBC, IRON, RETICCTPCT in the last 72 hours. Sepsis Labs: No results for input(s): PROCALCITON, LATICACIDVEN in the last 168 hours.  Recent Results (from the past 240 hour(s))  SARS CORONAVIRUS 2 (TAT 6-24 HRS) Nasopharyngeal Nasopharyngeal Swab     Status: None   Collection Time: 03/06/20  9:39 PM   Specimen: Nasopharyngeal Swab  Result Value Ref Range Status   SARS Coronavirus 2 NEGATIVE NEGATIVE Final    Comment: (NOTE) SARS-CoV-2 target nucleic acids are NOT DETECTED. The SARS-CoV-2 RNA is generally detectable in upper and lower respiratory specimens during the acute phase of infection. Negative results do not preclude SARS-CoV-2 infection, do not rule out co-infections with other pathogens, and should not be used as the sole basis for treatment or other patient management decisions. Negative results must be combined with clinical observations, patient history, and epidemiological information. The expected result is Negative. Fact Sheet for Patients: SugarRoll.be Fact  Sheet for Healthcare Providers: https://www.woods-mathews.com/ This test is not yet approved or cleared by the [place] FDA and  has been authorized for detection and/or diagnosis of SARS-CoV-2 by FDA under an Emergency Use Authorization (EUA). This EUA will remain  in effect (meaning this test can be used) for the duration of the COVID-19 declaration under Section 56 4(b)(1) of the Act, 21 U.S.C. section 360bbb-3(b)(1), unless the authorization is terminated or revoked sooner. Performed at [facility], [place]  [address] [place], [place] [address]          Radiology Studies: DG Chest 2 View  Result Date: 03/06/2020 CLINICAL DATA:  Recent fall with right-sided rib pain, initial encounter EXAM: CHEST - 2 VIEW COMPARISON:  07/14/2015 FINDINGS: Cardiac shadow is mildly enlarged but stable. Aortic calcifications are seen. The lungs are well aerated bilaterally without focal infiltrate, effusion or pneumothorax. Multiple rib fractures are seen involving the right fourth through ninth ribs posterolaterally. No compression deformity is noted. IMPRESSION: Multiple right-sided rib fractures without complicating factors. Electronically Signed   By: [name] M.D.   On: 03/06/2020 19:43        Scheduled Meds: . acetaminophen  650 mg Oral Q8H  . donepezil  10 mg Oral QHS  . losartan  25 mg Oral Daily  . memantine  10 mg Oral BID  . metoprolol succinate  100 mg Oral Daily  . multivitamin with minerals  1 tablet Oral Daily  . sodium chloride flush  3 mL Intravenous Q12H   Continuous Infusions:   Time spent: 26 minutes with over 50% of the time coordinating the patient's care    Harold Hedge, DO [facility] Pager [phone]  Call night coverage person covering after 7pm
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
## 2020-03-08 DIAGNOSIS — S2241XA Multiple fractures of ribs, right side, initial encounter for closed fracture: Secondary | ICD-10-CM | POA: Diagnosis not present

## 2020-03-08 DIAGNOSIS — I110 Hypertensive heart disease with heart failure: Secondary | ICD-10-CM | POA: Diagnosis present

## 2020-03-08 DIAGNOSIS — C9110 Chronic lymphocytic leukemia of B-cell type not having achieved remission: Secondary | ICD-10-CM | POA: Diagnosis present

## 2020-03-08 DIAGNOSIS — I5032 Chronic diastolic (congestive) heart failure: Secondary | ICD-10-CM | POA: Diagnosis present

## 2020-03-08 DIAGNOSIS — Z8249 Family history of ischemic heart disease and other diseases of the circulatory system: Secondary | ICD-10-CM | POA: Diagnosis not present

## 2020-03-08 DIAGNOSIS — W010XXA Fall on same level from slipping, tripping and stumbling without subsequent striking against object, initial encounter: Secondary | ICD-10-CM | POA: Diagnosis present

## 2020-03-08 DIAGNOSIS — E871 Hypo-osmolality and hyponatremia: Secondary | ICD-10-CM | POA: Diagnosis present

## 2020-03-08 DIAGNOSIS — R69 Illness, unspecified: Secondary | ICD-10-CM | POA: Diagnosis not present

## 2020-03-08 DIAGNOSIS — Z9012 Acquired absence of left breast and nipple: Secondary | ICD-10-CM | POA: Diagnosis not present

## 2020-03-08 DIAGNOSIS — F0390 Unspecified dementia without behavioral disturbance: Secondary | ICD-10-CM | POA: Diagnosis present

## 2020-03-08 DIAGNOSIS — Z853 Personal history of malignant neoplasm of breast: Secondary | ICD-10-CM | POA: Diagnosis not present

## 2020-03-08 DIAGNOSIS — Z79899 Other long term (current) drug therapy: Secondary | ICD-10-CM | POA: Diagnosis not present

## 2020-03-08 DIAGNOSIS — D696 Thrombocytopenia, unspecified: Secondary | ICD-10-CM | POA: Diagnosis present

## 2020-03-08 DIAGNOSIS — Z20822 Contact with and (suspected) exposure to covid-19: Secondary | ICD-10-CM | POA: Diagnosis present

## 2020-03-08 DIAGNOSIS — J9601 Acute respiratory failure with hypoxia: Secondary | ICD-10-CM | POA: Diagnosis present

## 2020-03-08 DIAGNOSIS — F05 Delirium due to known physiological condition: Secondary | ICD-10-CM | POA: Diagnosis not present

## 2020-03-08 LAB — BASIC METABOLIC PANEL
Anion gap: 7 (ref 5–15)
BUN: 18 mg/dL (ref 8–23)
CO2: 22 mmol/L (ref 22–32)
Calcium: 8.4 mg/dL — ABNORMAL LOW (ref 8.9–10.3)
Chloride: 100 mmol/L (ref 98–111)
Creatinine, Ser: 0.64 mg/dL (ref 0.44–1.00)
GFR calc Af Amer: >60 mL/min (ref >60)
GFR calc non Af Amer: >60 mL/min (ref >60)
Glucose, Bld: 115 mg/dL — ABNORMAL HIGH (ref 70–99)
Potassium: 4.1 mmol/L (ref 3.5–5.1)
Sodium: 129 mmol/L — ABNORMAL LOW (ref 135–145)

## 2020-03-08 LAB — CBC
HCT: 36 % (ref 36.0–46.0)
Hemoglobin: 12.1 g/dL (ref 12.0–15.0)
MCH: 33.7 pg (ref 26.0–34.0)
MCHC: 33.6 g/dL (ref 30.0–36.0)
MCV: 100.3 fL — ABNORMAL HIGH (ref 80.0–100.0)
Platelets: 92 10*3/uL — ABNORMAL LOW (ref 150–400)
RBC: 3.59 MIL/uL — ABNORMAL LOW (ref 3.87–5.11)
RDW: 12.8 % (ref 11.5–15.5)
WBC: 16.4 10*3/uL — ABNORMAL HIGH (ref 4.0–10.5)
nRBC: 0.1 % (ref 0.0–0.2)

## 2020-03-08 LAB — SODIUM, URINE, RANDOM: Sodium, Ur: 37 mmol/L

## 2020-03-08 LAB — OSMOLALITY: Osmolality: 276 mOsm/kg (ref 275–295)

## 2020-03-08 LAB — OSMOLALITY, URINE: Osmolality, Ur: 642 mOsm/kg (ref 300–900)
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
## 2020-03-08 NOTE — Progress Notes (Signed)
PROGRESS NOTE    Heather Knapp    Code Status: Full Code  [id number] DOB: 05-20-1934 DOA: 03/06/2020 LOS: 1 days  PCP: Heather Ruff, MD CC:  Chief Complaint  Patient presents with  . [facility] Summary   This is an 86 year old female with history of chronic diastolic heart failure, CLL, hypertension, dementia admitted to [facility] on 3/30 following mechanical fall with right-sided back pain and subsequent right-sided rib fractures.  Admitted for pain management.  A & P   Principal Problem:   Multiple fractures of ribs, right side, initial encounter for closed fracture Active Problems:   CLL (chronic lymphocytic leukemia) (HCC)   Essential hypertension   Chronic diastolic CHF (congestive heart failure) (HCC)   Rib fractures   1. Right sided back pain/ribs 4 through 9 fracture status post mechanical fall standing height a. No pneumothorax on CXR b. Discussed with orthopedic surgery, no surgical intervention warranted c. Continue with pain management: Standing Tylenol, Percocet as needed and morphine added as well as Robaxin d. Strongly encourage incentive spirometry as rib fractures can lead to atelectasis and pneumonia e. PT recommending SNF  2. Acute hypoxic respiratory failure, suspect secondary to atelectasis in setting of rib fractures a. Encourage incentive spirometry and proper breathing techniques   3. Hyponatremia, suspect SIADH in setting of acute pain a. Check sodium studies to confirm  4. Mechanical fall from standing height a. PT recommending SNF  5. CLL, Stable  6. Compensated HFpEF a. I/O, daily weights  7. Hypertension a. Continue Toprol-XL and losartan  8. Dementia a. Lives alone b. Continue Aricept  DVT prophylaxis: Lovenox, SCDs Family Communication: Discussed with daughter over the phone Disposition Plan:   Patient came from:   Home                                                                                           Anticipated d/c place: SNF  Barriers to d/c: Pain management, SNF  Pressure injury documentation    None  Consultants  None  Procedures  None  Antibiotics   Anti-infectives (From admission, onward)   None        Subjective   Seen and examined sitting upright in chair in no acute distress resting comfortably.  States that her pain is better controlled today.  Overnight had some increased oxygen requirements but was not febrile.  No other issues or complaints.  During our encounter I turned off patient's supplemental O2 and she remained with SPO2 in low to mid 90s  Objective   Vitals:   03/07/20 1027 03/07/20 1338 03/07/20 2041 03/08/20 0502  BP: (!) 134/54 (!) 145/58 136/70 (!) 150/70  Pulse: 65 71 62 65  Resp:  18 16 18   Temp:  (!) 97.2 F (36.2 C) 99 F (37.2 C) 98.4 F (36.9 C)  TempSrc:  Oral Oral Oral  SpO2:  94% 94% 97%  Weight:    72.3 kg    Intake/Output Summary (Last 24 hours) at 03/08/2020 0741 Last data filed at 03/08/2020 0502 Gross per 24 hour  Intake 1120 ml  Output 1200 ml  Net -80 ml   Filed Weights   03/08/20 0502  Weight: 72.3 kg    Examination:  Physical Exam Vitals and nursing note reviewed.  Constitutional:      Appearance: Normal appearance.  HENT:     Head: Normocephalic and atraumatic.  Eyes:     Conjunctiva/sclera: Conjunctivae normal.  Cardiovascular:     Rate and Rhythm: Normal rate and regular rhythm.  Pulmonary:     Effort: Pulmonary effort is normal.     Breath sounds: Normal breath sounds.  Abdominal:     General: Abdomen is flat.     Palpations: Abdomen is soft.  Musculoskeletal:        General: No swelling or tenderness.  Skin:    Coloration: Skin is not jaundiced or pale.  Neurological:     Mental Status: She is alert. Mental status is at baseline.  Psychiatric:        Mood and Affect: Mood normal.        Behavior: Behavior normal.     Data Reviewed: I have personally reviewed following labs and  imaging studies  CBC: Recent Labs  Lab 03/06/20 2139 03/07/20 0449 03/07/20 1813 03/08/20 0427  WBC 20.6* 18.2* 17.0* 16.4*  NEUTROABS  --  4.6  --   --   HGB 14.2 12.6 13.2 12.1  HCT 41.9 38.5 39.4 36.0  MCV 100.5* 100.5* 100.3* 100.3*  PLT 103* 106* 101* 92*   Basic Metabolic Panel: Recent Labs  Lab 03/06/20 2139 03/07/20 0449 03/07/20 1813 03/08/20 0427  NA 135 135  --  129*  K 4.1 4.8  --  4.1  CL 101 101  --  100  CO2 23 27  --  22  GLUCOSE 119* 135*  --  115*  BUN 22 24*  --  18  CREATININE 0.73 0.78 0.72 0.64  CALCIUM 9.8 9.3  --  8.4*  MG 2.2 2.1  --   --    GFR: CrCl cannot be calculated (Unknown ideal weight.). Liver Function Tests: Recent Labs  Lab 03/06/20 2139  AST 26  ALT 22  ALKPHOS 36*  BILITOT 1.0  PROT 6.3*  ALBUMIN 4.3   No results for input(s): LIPASE, AMYLASE in the last 168 hours. No results for input(s): AMMONIA in the last 168 hours. Coagulation Profile: No results for input(s): INR, PROTIME in the last 168 hours. Cardiac Enzymes: No results for input(s): CKTOTAL, CKMB, CKMBINDEX, TROPONINI in the last 168 hours. BNP (last 3 results) No results for input(s): PROBNP in the last 8760 hours. HbA1C: No results for input(s): HGBA1C in the last 72 hours. CBG: No results for input(s): GLUCAP in the last 168 hours. Lipid Profile: No results for input(s): CHOL, HDL, LDLCALC, TRIG, CHOLHDL, LDLDIRECT in the last 72 hours. Thyroid Function Tests: No results for input(s): TSH, T4TOTAL, FREET4, T3FREE, THYROIDAB in the last 72 hours. Anemia Panel: No results for input(s): VITAMINB12, FOLATE, FERRITIN, TIBC, IRON, RETICCTPCT in the last 72 hours. Sepsis Labs: No results for input(s): PROCALCITON, LATICACIDVEN in the last 168 hours.  Recent Results (from the past 240 hour(s))  SARS CORONAVIRUS 2 (TAT 6-24 HRS) Nasopharyngeal Nasopharyngeal Swab     Status: None   Collection Time: 03/06/20  9:39 PM   Specimen: Nasopharyngeal Swab  Result  Value Ref Range Status   SARS Coronavirus 2 NEGATIVE NEGATIVE Final    Comment: (NOTE) SARS-CoV-2 target nucleic acids are NOT DETECTED. The SARS-CoV-2 RNA is generally detectable in upper and lower respiratory specimens during  the acute phase of infection. Negative results do not preclude SARS-CoV-2 infection, do not rule out co-infections with other pathogens, and should not be used as the sole basis for treatment or other patient management decisions. Negative results must be combined with clinical observations, patient history, and epidemiological information. The expected result is Negative. Fact Sheet for Patients: SugarRoll.be Fact Sheet for Healthcare Providers: https://www.woods-mathews.com/ This test is not yet approved or cleared by the [place] FDA and  has been authorized for detection and/or diagnosis of SARS-CoV-2 by FDA under an Emergency Use Authorization (EUA). This EUA will remain  in effect (meaning this test can be used) for the duration of the COVID-19 declaration under Section 56 4(b)(1) of the Act, 21 U.S.C. section 360bbb-3(b)(1), unless the authorization is terminated or revoked sooner. Performed at [facility], [place] [address] [place], [place] [address]          Radiology Studies: DG Chest 2 View  Result Date: 03/06/2020 CLINICAL DATA:  Recent fall with right-sided rib pain, initial encounter EXAM: CHEST - 2 VIEW COMPARISON:  07/14/2015 FINDINGS: Cardiac shadow is mildly enlarged but stable. Aortic calcifications are seen. The lungs are well aerated bilaterally without focal infiltrate, effusion or pneumothorax. Multiple rib fractures are seen involving the right fourth through ninth ribs posterolaterally. No compression deformity is noted. IMPRESSION: Multiple right-sided rib fractures without complicating factors. Electronically Signed   By: [name] M.D.   On: 03/06/2020 19:43         Scheduled Meds: . acetaminophen  650 mg Oral Q8H  . donepezil  10 mg Oral QHS  . enoxaparin (LOVENOX) injection  40 mg Subcutaneous Q24H  . losartan  25 mg Oral Daily  . memantine  10 mg Oral BID  . metoprolol succinate  100 mg Oral Daily  . multivitamin with minerals  1 tablet Oral Daily  . sodium chloride flush  3 mL Intravenous Q12H   Continuous Infusions:   Time spent: 27 minutes with over 50% of the time coordinating the patient's care    Harold Hedge, DO [facility] Pager [phone]  Call night coverage person covering after 7pm

## 2020-03-08 NOTE — TOC Progression Note (Addendum)
Transition of Care [facility]) - Progression Note    Patient Details  Name: Heather Knapp MRN: [id number] Date of Birth: 1934-03-22  Transition of [facility]) CM/SW [place], [place] Phone Number: 03/08/2020, 2:59 PM  Clinical Narrative:   Patient and her daughter still decline SNF placement. Patient daughter express the patient had a bad experience at SNF about three years ago. She reports she will consider the [facility] or [facility]. CSW reached out to [place] beds available. CSW left voicemail for [facility] admissions coordinator Katie. Daughter is agreeable to fax out to other high rated facilities per [email or web address] CSW sent referrals via HUB.   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   Patient will need [employer] Authorization if she goes to SNF.    Expected Discharge Plan: [place] Barriers to Discharge: Continued Medical Work up  Expected Discharge Plan and Services Expected Discharge Plan: [place] In-house Referral: Clinical Social Work Discharge Planning Services: CM Consult Post Acute Care Choice: [place] arrangements for the past 2 months: Single Family Home                           HH Arranged: PT, OT [place] Agency: [place] ([facility]) Date [place]: 03/07/20 Time [place]: [phone] Representative spoke with at [place]: [place] ([place]) Interventions    Readmission Risk Interventions No flowsheet data found.

## 2020-03-08 NOTE — Progress Notes (Signed)
2L 02 was applied because Sats were remaining at 89 to 90% on room air during evening shift.

## 2020-03-09 LAB — BASIC METABOLIC PANEL
Anion gap: 12 (ref 5–15)
BUN: 16 mg/dL (ref 8–23)
CO2: 22 mmol/L (ref 22–32)
Calcium: 8.8 mg/dL — ABNORMAL LOW (ref 8.9–10.3)
Chloride: 98 mmol/L (ref 98–111)
Creatinine, Ser: 0.78 mg/dL (ref 0.44–1.00)
GFR calc Af Amer: >60 mL/min (ref >60)
GFR calc non Af Amer: >60 mL/min (ref >60)
Glucose, Bld: 162 mg/dL — ABNORMAL HIGH (ref 70–99)
Potassium: 4.1 mmol/L (ref 3.5–5.1)
Sodium: 132 mmol/L — ABNORMAL LOW (ref 135–145)

## 2020-03-09 MED ORDER — SENNA 8.6 MG PO TABS
1.0000 | ORAL_TABLET | Freq: Every day | ORAL | Status: DC | PRN
  Administered 2020-03-11: 8.6 mg via ORAL
  Filled 2020-03-09 (×2): qty 1

## 2020-03-09 MED ORDER — SODIUM CHLORIDE 0.9 % IV SOLN: INTRAVENOUS | Status: AC

## 2020-03-09 MED ORDER — POLYETHYLENE GLYCOL 3350 17 G PO PACK
17.0000 g | PACK | Freq: Every day | ORAL | Status: DC
  Administered 2020-03-09 – 2020-03-12 (×3): 17 g via ORAL
  Filled 2020-03-09 (×4): qty 1

## 2020-03-09 NOTE — Progress Notes (Signed)
Physical Therapy Treatment Patient Details Name: Heather Knapp MRN: [id number] DOB: [birth year] Today's Date: 03/09/2020    History of Present Illness Pt is 86 yo female with PMH including thrombocytopenia, osteopenia, memory loss, HTN, hyperlipidemia, breast CA, and CLL.  She was admitted s/p fall with multiple R rib fractures.    PT Comments    Pt very cooperative and able to ambulate short distance in hall with RW as well as up to bathroom to toilet and perform hand hygiene at sink but continues ltd by R rib pain.   Follow Up Recommendations  SNF     Equipment Recommendations  Rolling walker with 5" wheels;3in1 (PT)    Recommendations for Other Services       Precautions / Restrictions Precautions Precautions: Fall Restrictions Weight Bearing Restrictions: No    Mobility  Bed Mobility               General bed mobility comments: Pt up in chair and requests back to same  Transfers Overall transfer level: Needs assistance Equipment used: Rolling walker (2 wheeled) Transfers: Sit to/from Stand Sit to Stand: Min assist         General transfer comment: to/from recliner and BSC; cues for use of UEs to self assist  Ambulation/Gait Ambulation/Gait assistance: Min assist;+2 safety/equipment(chair follow 2* pt anxiety) Gait Distance (Feet): 70 Feet(and 2x15' to/from bathroom) Assistive device: Rolling walker (2 wheeled) Gait Pattern/deviations: Step-through pattern;Decreased step length - right;Decreased step length - left;Shuffle;Trunk flexed Gait velocity: decreased   General Gait Details: cues for posture and position from [employer]             Wheelchair Mobility    Modified Rankin (Stroke Patients Only)       Balance Overall balance assessment: Needs assistance Sitting-balance support: Bilateral upper extremity supported;Feet supported Sitting balance-Leahy Scale: Fair     Standing balance support: Bilateral upper extremity  supported Standing balance-Leahy Scale: Fair Standing balance comment: required assist for balance with toielting adls                            Cognition Arousal/Alertness: Awake/alert Behavior During Therapy: WFL for tasks assessed/performed Overall Cognitive Status: No family/caregiver present to determine baseline cognitive functioning Area of Impairment: Following commands                     Memory: Decreased short-term memory Following Commands: Follows one step commands with increased time;Follows multi-step commands inconsistently              Exercises      General Comments        Pertinent Vitals/Pain Pain Assessment: Faces Faces Pain Scale: Hurts even more Pain Location: R ribs with laughing, coughing, some movements Pain Descriptors / Indicators: Grimacing;Guarding Pain Intervention(s): Limited activity within patient's tolerance;Monitored during session;Premedicated before session    Home Living                      Prior Function            PT Goals (current goals can now be found in the care plan section) Acute Rehab PT Goals Patient Stated Goal: less pain; regain IND PT Goal Formulation: With patient Time For Goal Achievement: 03/21/20 Potential to Achieve Goals: Good Progress towards PT goals: Progressing toward goals    Frequency    Min 3X/week      PT Plan Current plan  remains appropriate    Co-evaluation              AM-PAC PT "6 Clicks" Mobility   Outcome Measure  Help needed turning from your back to your side while in a flat bed without using bedrails?: A Lot Help needed moving from lying on your back to sitting on the side of a flat bed without using bedrails?: A Lot Help needed moving to and from a bed to a chair (including a wheelchair)?: A Little Help needed standing up from a chair using your arms (e.g., wheelchair or bedside chair)?: A Little Help needed to walk in hospital room?: A  Little Help needed climbing 3-5 steps with a railing? : A Lot 6 Click Score: 15    End of Session   Activity Tolerance: Patient limited by pain Patient left: in chair;with call bell/phone within reach;with chair alarm set Nurse Communication: Mobility status PT Visit Diagnosis: History of falling (Z91.81);Unsteadiness on feet (R26.81);Pain Pain - Right/Left: Right     Time: [id number] PT Time Calculation (min) (ACUTE ONLY): 35 min  Charges:  $Gait Training: 8-22 mins $Therapeutic Activity: 8-22 mins                     Debe Coder PT Acute Rehabilitation Services Pager [phone] Office [phone]    Heather Knapp 03/09/2020, 11:24 AM

## 2020-03-09 NOTE — Care Management Important Message (Signed)
Important Message  Patient Details IM Letter given to Kathrin Greathouse SW Case Manager to present to the Patient Name: Heather Knapp MRN: [id number] Date of Birth: 1934-03-17   Medicare Important Message Given:  Yes     Kerin Salen 03/09/2020, 10:21 AM

## 2020-03-09 NOTE — Progress Notes (Signed)
PROGRESS NOTE    Heather Knapp    Code Status: Full Code  IR:[id number] DOB: 13-Apr-1934 DOA: 03/06/2020 LOS: 2 days  PCP: Leighton Ruff, MD CC:  Chief Complaint  Patient presents with  . [facility] Summary   This is an 86 year old female with history of chronic diastolic heart failure, CLL, hypertension, dementia admitted to [facility] on 3/30 following mechanical fall with right-sided back pain and subsequent right-sided rib fractures.  Admitted for pain management.  Currently awaiting SNF placement  A & P   Principal Problem:   Multiple fractures of ribs, right side, initial encounter for closed fracture Active Problems:   CLL (chronic lymphocytic leukemia) (HCC)   Essential hypertension   Chronic diastolic CHF (congestive heart failure) (HCC)   Rib fractures   1. Right sided back pain/ribs 4 through 9 fracture status post mechanical fall standing height a. Improved pain b. No pneumothorax on CXR c. no surgical intervention warranted d. Continue with pain management: Standing Tylenol, Percocet as needed and morphine added as well as Robaxin e. Strongly encourage incentive spirometry as rib fractures can lead to atelectasis and pneumonia f. PT recommending SNF  2. Acute hypoxic respiratory failure, suspect secondary to atelectasis in setting of rib fractures a. Encourage incentive spirometry and proper breathing techniques   3. Hyponatremia, improved, not resolved a. Will give a trial of IV fluids as she has poor PO intake   4. Mechanical fall from standing height a. PT recommending SNF  5. CLL, Stable  6. Compensated HFpEF a. I/O, daily weights  7. Hypertension a. Continue Toprol-XL and losartan  8. Dementia a. Lives alone b. Continue Aricept  DVT prophylaxis: Lovenox, SCDs Family Communication: Discussed with daughter over the phone yesterday Disposition Plan:   Patient came from:   Home                                                                                           Anticipated d/c place: SNF  Barriers to d/c: awaiting SNF placement  Pressure injury documentation    None  Consultants  None  Procedures  None  Antibiotics   Anti-infectives (From admission, onward)   None        Subjective   Reports poor appetite today. No other complaints no issues overnight   Objective   Vitals:   03/08/20 1435 03/08/20 2152 03/09/20 0535 03/09/20 1345  BP: (!) 109/35 (!) 155/76 (!) 150/82 (!) 156/59  Pulse: 60 67 66 65  Resp: 14 19 17 18   Temp: 98.7 F (37.1 C) (!) 97.3 F (36.3 C) 98.3 F (36.8 C) 98.5 F (36.9 C)  TempSrc: Oral Oral Oral Oral  SpO2: 93% 91% 95% 95%  Weight:      Height:        Intake/Output Summary (Last 24 hours) at 03/09/2020 1532 Last data filed at 03/09/2020 1300 Gross per 24 hour  Intake 1312.12 ml  Output 2500 ml  Net -1187.88 ml   Filed Weights   03/08/20 0502 03/08/20 1019  Weight: 72.3 kg 72.3 kg    Examination:  Physical Exam Vitals and  nursing note reviewed.  Constitutional:      Appearance: Normal appearance.  HENT:     Head: Normocephalic and atraumatic.  Eyes:     Conjunctiva/sclera: Conjunctivae normal.  Cardiovascular:     Rate and Rhythm: Normal rate and regular rhythm.  Pulmonary:     Effort: Pulmonary effort is normal.     Breath sounds: Normal breath sounds.     Comments: Good inspiratory effort today Abdominal:     General: Abdomen is flat.     Palpations: Abdomen is soft.  Musculoskeletal:        General: No swelling or tenderness.  Skin:    Coloration: Skin is not jaundiced or pale.  Neurological:     Mental Status: She is alert. Mental status is at baseline.     Data Reviewed: I have personally reviewed following labs and imaging studies  CBC: Recent Labs  Lab 03/06/20 2139 03/07/20 0449 03/07/20 1813 03/08/20 0427  WBC 20.6* 18.2* 17.0* 16.4*  NEUTROABS  --  4.6  --   --   HGB 14.2 12.6 13.2 12.1  HCT 41.9 38.5 39.4 36.0  MCV  100.5* 100.5* 100.3* 100.3*  PLT 103* 106* 101* 92*   Basic Metabolic Panel: Recent Labs  Lab 03/06/20 2139 03/07/20 0449 03/07/20 1813 03/08/20 0427 03/09/20 0858  NA 135 135  --  129* 132*  K 4.1 4.8  --  4.1 4.1  CL 101 101  --  100 98  CO2 23 27  --  22 22  GLUCOSE 119* 135*  --  115* 162*  BUN 22 24*  --  18 16  CREATININE 0.73 0.78 0.72 0.64 0.78  CALCIUM 9.8 9.3  --  8.4* 8.8*  MG 2.2 2.1  --   --   --    GFR: Estimated Creatinine Clearance: 51.9 mL/min (by C-G formula based on SCr of 0.78 mg/dL). Liver Function Tests: Recent Labs  Lab 03/06/20 2139  AST 26  ALT 22  ALKPHOS 36*  BILITOT 1.0  PROT 6.3*  ALBUMIN 4.3   No results for input(s): LIPASE, AMYLASE in the last 168 hours. No results for input(s): AMMONIA in the last 168 hours. Coagulation Profile: No results for input(s): INR, PROTIME in the last 168 hours. Cardiac Enzymes: No results for input(s): CKTOTAL, CKMB, CKMBINDEX, TROPONINI in the last 168 hours. BNP (last 3 results) No results for input(s): PROBNP in the last 8760 hours. HbA1C: No results for input(s): HGBA1C in the last 72 hours. CBG: No results for input(s): GLUCAP in the last 168 hours. Lipid Profile: No results for input(s): CHOL, HDL, LDLCALC, TRIG, CHOLHDL, LDLDIRECT in the last 72 hours. Thyroid Function Tests: No results for input(s): TSH, T4TOTAL, FREET4, T3FREE, THYROIDAB in the last 72 hours. Anemia Panel: No results for input(s): VITAMINB12, FOLATE, FERRITIN, TIBC, IRON, RETICCTPCT in the last 72 hours. Sepsis Labs: No results for input(s): PROCALCITON, LATICACIDVEN in the last 168 hours.  Recent Results (from the past 240 hour(s))  SARS CORONAVIRUS 2 (TAT 6-24 HRS) Nasopharyngeal Nasopharyngeal Swab     Status: None   Collection Time: 03/06/20  9:39 PM   Specimen: Nasopharyngeal Swab  Result Value Ref Range Status   SARS Coronavirus 2 NEGATIVE NEGATIVE Final    Comment: (NOTE) SARS-CoV-2 target nucleic acids are NOT  DETECTED. The SARS-CoV-2 RNA is generally detectable in upper and lower respiratory specimens during the acute phase of infection. Negative results do not preclude SARS-CoV-2 infection, do not rule out co-infections with other pathogens,  and should not be used as the sole basis for treatment or other patient management decisions. Negative results must be combined with clinical observations, patient history, and epidemiological information. The expected result is Negative. Fact Sheet for Patients: SugarRoll.be Fact Sheet for Healthcare Providers: https://www.woods-mathews.com/ This test is not yet approved or cleared by the [place] FDA and  has been authorized for detection and/or diagnosis of SARS-CoV-2 by FDA under an Emergency Use Authorization (EUA). This EUA will remain  in effect (meaning this test can be used) for the duration of the COVID-19 declaration under Section 56 4(b)(1) of the Act, 21 U.S.C. section 360bbb-3(b)(1), unless the authorization is terminated or revoked sooner. Performed at [facility], [place] [address], [place],  [address]          Radiology Studies: No results found.      Scheduled Meds: . acetaminophen  650 mg Oral Q8H  . donepezil  10 mg Oral QHS  . enoxaparin (LOVENOX) injection  40 mg Subcutaneous Q24H  . losartan  25 mg Oral Daily  . memantine  10 mg Oral BID  . metoprolol succinate  100 mg Oral Daily  . multivitamin with minerals  1 tablet Oral Daily  . polyethylene glycol  17 g Oral Daily  . sodium chloride flush  3 mL Intravenous Q12H   Continuous Infusions: . sodium chloride 100 mL/hr at 03/09/20 1143     Time spent: 20 minutes with over 50% of the time coordinating the patient's care    Harold Hedge, DO [facility] Pager [phone]  Call night coverage person covering after 7pm

## 2020-03-09 NOTE — TOC Progression Note (Signed)
Transition of Care [facility]) - Progression Note    Patient Details  Name: Heather Knapp MRN: [id number] Date of Birth: February 01, 1934  Transition of Care [facility]) CM/SW [place], [place] Phone Number: 03/09/2020, 11:24 AM  Clinical Narrative:    Patient and her daughter agreeable to [facility].  [employer] Medicare-Authorization process initiated/SNF placement pending approval.   Expected Discharge Plan: [place] Barriers to Discharge: Ship broker  Expected Discharge Plan and Services Expected Discharge Plan: [place] In-house Referral: Clinical Social Work Discharge Planning Services: CM Consult Post Acute Care Choice: [place] arrangements for the past 2 months: Single Family Home                           HH Arranged: PT, OT [place] Agency: [place] ([place]) Date [place]: 03/07/20 Time [place]: [id number] Representative spoke with at [place]: [place] ([place]) Interventions    Readmission Risk Interventions No flowsheet data found.

## 2020-03-10 MED ORDER — AMLODIPINE BESYLATE 5 MG PO TABS
5.0000 mg | ORAL_TABLET | Freq: Every day | ORAL | Status: DC
  Administered 2020-03-10 – 2020-03-12 (×3): 5 mg via ORAL
  Filled 2020-03-10 (×3): qty 1

## 2020-03-10 NOTE — Progress Notes (Signed)
OT Cancellation Note  Patient Details Name: Heather Knapp MRN: [id number] DOB: Oct 30, 1934   Cancelled Treatment:    Reason Eval/Treat Not Completed: Fatigue/lethargy limiting ability to participate. Pt has been restless all day, requiring increased orientation. Pt sitting EOB and stated "I am too tired to even lift my feet into bed" OT will continue to follow for evaluation.  Merri Ray Judyth Demarais 03/10/2020, 2:21 PM   Jesse Sans OTR/L Acute Rehabilitation Services Pager: [phone] Office: [phone]

## 2020-03-10 NOTE — Progress Notes (Signed)
PT Cancellation Note  Patient Details Name: Heather Knapp MRN: [id number] DOB: 05/14/1934   Cancelled Treatment:     PT attempted x 2 this date - cancelled in am 2* increased confusion/agitation; cancelled in pm 2* fatigue.  Will follow.   Alleene Stoy 03/10/2020, 2:23 PM

## 2020-03-10 NOTE — Progress Notes (Signed)
PROGRESS NOTE    Heather Knapp    Code Status: Full Code  IR:[id number] DOB: [birth year] DOA: 03/06/2020 LOS: 3 days  PCP: Leighton Ruff, MD CC:  Chief Complaint  Patient presents with  . [facility] Summary   This is an 86 year old female with history of chronic diastolic heart failure, CLL, hypertension, dementia admitted to [facility] on 3/30 following mechanical fall with right-sided back pain and subsequent right-sided rib fractures.  Admitted for pain management.  Currently awaiting SNF placement  A & P   Principal Problem:   Multiple fractures of ribs, right side, initial encounter for closed fracture Active Problems:   CLL (chronic lymphocytic leukemia) (HCC)   Essential hypertension   Chronic diastolic CHF (congestive heart failure) (HCC)   Rib fractures   1. Right sided back pain/ribs 4 through 9 fracture status post mechanical fall from standing height a. Pain well controlled. continue standing Tylenol, Percocet as needed and morphine added as well as Robaxin b. No pneumothorax on CXR c. Continue with pain management:  d. Strongly encourage incentive spirometry as rib fractures can lead to atelectasis and pneumonia e. PT recommending SNF  2. Acute hypoxic respiratory failure, suspect secondary to atelectasis in setting of rib fractures a. Encourage incentive spirometry and proper breathing techniques   3. Hyponatremia, improved, not resolved  4. CLL, Stable  5. Compensated HFpEF a. I/O, daily weights  6. Hypertension a. Continue Toprol-XL and losartan  7. Dementia a. Lives alone b. Continue Aricept c. Delirium precautions  DVT prophylaxis: Lovenox, SCDs Family Communication: Discussed with daughter at bedside today Disposition Plan:   Patient came from:   Home                                                                                          Anticipated d/c place: SNF  Barriers to d/c: awaiting SNF placement  Pressure injury  documentation    None  Consultants  None  Procedures  None  Antibiotics   Anti-infectives (From admission, onward)   None        Subjective   No complaints, no overnight events. Daughter at bedside.   Objective   Vitals:   03/09/20 0535 03/09/20 1345 03/09/20 2032 03/10/20 0429  BP: (!) 150/82 (!) 156/59 (!) 164/68 (!) 172/69  Pulse: 66 65 62 73  Resp: 17 18 16 16   Temp: 98.3 F (36.8 C) 98.5 F (36.9 C) 98.2 F (36.8 C) 98.6 F (37 C)  TempSrc: Oral Oral Oral   SpO2: 95% 95% 98% 96%  Weight:      Height:        Intake/Output Summary (Last 24 hours) at 03/10/2020 1114 Last data filed at 03/10/2020 0931 Gross per 24 hour  Intake 1776.28 ml  Output 3250 ml  Net -1473.72 ml   Filed Weights   03/08/20 0502 03/08/20 1019  Weight: 72.3 kg 72.3 kg    Examination:  Physical Exam Vitals and nursing note reviewed.  Constitutional:      Appearance: Normal appearance.  HENT:     Head: Normocephalic and atraumatic.  Eyes:  Conjunctiva/sclera: Conjunctivae normal.  Cardiovascular:     Rate and Rhythm: Normal rate and regular rhythm.  Pulmonary:     Effort: Pulmonary effort is normal.     Breath sounds: Normal breath sounds.  Abdominal:     General: Abdomen is flat.     Palpations: Abdomen is soft.  Musculoskeletal:        General: No swelling or tenderness.     Comments: No tenderness to palpation over right ribs  Skin:    Coloration: Skin is not jaundiced or pale.  Neurological:     Mental Status: She is alert. Mental status is at baseline.  Psychiatric:        Mood and Affect: Mood normal.        Behavior: Behavior normal.     Data Reviewed: I have personally reviewed following labs and imaging studies  CBC: Recent Labs  Lab 03/06/20 2139 03/07/20 0449 03/07/20 1813 03/08/20 0427  WBC 20.6* 18.2* 17.0* 16.4*  NEUTROABS  --  4.6  --   --   HGB 14.2 12.6 13.2 12.1  HCT 41.9 38.5 39.4 36.0  MCV 100.5* 100.5* 100.3* 100.3*  PLT 103* 106*  101* 92*   Basic Metabolic Panel: Recent Labs  Lab 03/06/20 2139 03/07/20 0449 03/07/20 1813 03/08/20 0427 03/09/20 0858  NA 135 135  --  129* 132*  K 4.1 4.8  --  4.1 4.1  CL 101 101  --  100 98  CO2 23 27  --  22 22  GLUCOSE 119* 135*  --  115* 162*  BUN 22 24*  --  18 16  CREATININE 0.73 0.78 0.72 0.64 0.78  CALCIUM 9.8 9.3  --  8.4* 8.8*  MG 2.2 2.1  --   --   --    GFR: Estimated Creatinine Clearance: 51.9 mL/min (by C-G formula based on SCr of 0.78 mg/dL). Liver Function Tests: Recent Labs  Lab 03/06/20 2139  AST 26  ALT 22  ALKPHOS 36*  BILITOT 1.0  PROT 6.3*  ALBUMIN 4.3   No results for input(s): LIPASE, AMYLASE in the last 168 hours. No results for input(s): AMMONIA in the last 168 hours. Coagulation Profile: No results for input(s): INR, PROTIME in the last 168 hours. Cardiac Enzymes: No results for input(s): CKTOTAL, CKMB, CKMBINDEX, TROPONINI in the last 168 hours. BNP (last 3 results) No results for input(s): PROBNP in the last 8760 hours. HbA1C: No results for input(s): HGBA1C in the last 72 hours. CBG: No results for input(s): GLUCAP in the last 168 hours. Lipid Profile: No results for input(s): CHOL, HDL, LDLCALC, TRIG, CHOLHDL, LDLDIRECT in the last 72 hours. Thyroid Function Tests: No results for input(s): TSH, T4TOTAL, FREET4, T3FREE, THYROIDAB in the last 72 hours. Anemia Panel: No results for input(s): VITAMINB12, FOLATE, FERRITIN, TIBC, IRON, RETICCTPCT in the last 72 hours. Sepsis Labs: No results for input(s): PROCALCITON, LATICACIDVEN in the last 168 hours.  Recent Results (from the past 240 hour(s))  SARS CORONAVIRUS 2 (TAT 6-24 HRS) Nasopharyngeal Nasopharyngeal Swab     Status: None   Collection Time: 03/06/20  9:39 PM   Specimen: Nasopharyngeal Swab  Result Value Ref Range Status   SARS Coronavirus 2 NEGATIVE NEGATIVE Final    Comment: (NOTE) SARS-CoV-2 target nucleic acids are NOT DETECTED. The SARS-CoV-2 RNA is generally  detectable in upper and lower respiratory specimens during the acute phase of infection. Negative results do not preclude SARS-CoV-2 infection, do not rule out co-infections with other pathogens, and  should not be used as the sole basis for treatment or other patient management decisions. Negative results must be combined with clinical observations, patient history, and epidemiological information. The expected result is Negative. Fact Sheet for Patients: SugarRoll.be Fact Sheet for Healthcare Providers: https://www.woods-mathews.com/ This test is not yet approved or cleared by the [place] FDA and  has been authorized for detection and/or diagnosis of SARS-CoV-2 by FDA under an Emergency Use Authorization (EUA). This EUA will remain  in effect (meaning this test can be used) for the duration of the COVID-19 declaration under Section 56 4(b)(1) of the Act, 21 U.S.C. section 360bbb-3(b)(1), unless the authorization is terminated or revoked sooner. Performed at [facility], [place] [address] [place],  [address]          Radiology Studies: No results found.      Scheduled Meds: . acetaminophen  650 mg Oral Q8H  . amLODipine  5 mg Oral Daily  . donepezil  10 mg Oral QHS  . enoxaparin (LOVENOX) injection  40 mg Subcutaneous Q24H  . losartan  25 mg Oral Daily  . memantine  10 mg Oral BID  . metoprolol succinate  100 mg Oral Daily  . multivitamin with minerals  1 tablet Oral Daily  . polyethylene glycol  17 g Oral Daily  . sodium chloride flush  3 mL Intravenous Q12H   Continuous Infusions:    Time spent: 16 minutes with over 50% of the time coordinating the patient's care    Harold Hedge, DO [facility] Pager [phone]  Call night coverage person covering after 7pm

## 2020-03-11 LAB — BASIC METABOLIC PANEL
Anion gap: 7 (ref 5–15)
BUN: 13 mg/dL (ref 8–23)
CO2: 25 mmol/L (ref 22–32)
Calcium: 8.3 mg/dL — ABNORMAL LOW (ref 8.9–10.3)
Chloride: 101 mmol/L (ref 98–111)
Creatinine, Ser: 0.6 mg/dL (ref 0.44–1.00)
GFR calc Af Amer: >60 mL/min (ref >60)
GFR calc non Af Amer: >60 mL/min (ref >60)
Glucose, Bld: 97 mg/dL (ref 70–99)
Potassium: 3.8 mmol/L (ref 3.5–5.1)
Sodium: 133 mmol/L — ABNORMAL LOW (ref 135–145)

## 2020-03-11 MED ORDER — KETOROLAC TROMETHAMINE 15 MG/ML IJ SOLN: 15.0000 mg | Freq: Three times a day (TID) | INTRAMUSCULAR | Status: DC | PRN

## 2020-03-11 MED ORDER — IBUPROFEN 400 MG PO TABS
600.0000 mg | ORAL_TABLET | Freq: Four times a day (QID) | ORAL | Status: DC | PRN
  Administered 2020-03-11: 600 mg via ORAL
  Filled 2020-03-11: qty 1

## 2020-03-11 MED ORDER — QUETIAPINE FUMARATE 25 MG PO TABS: 25.0000 mg | ORAL_TABLET | Freq: Two times a day (BID) | ORAL | Status: DC | PRN

## 2020-03-11 MED ORDER — QUETIAPINE FUMARATE 25 MG PO TABS: 25.0000 mg | ORAL_TABLET | Freq: Every day | ORAL | Status: DC

## 2020-03-11 MED ORDER — TRAMADOL HCL 50 MG PO TABS
50.0000 mg | ORAL_TABLET | Freq: Two times a day (BID) | ORAL | Status: DC | PRN
  Administered 2020-03-11 – 2020-03-12 (×2): 50 mg via ORAL
  Filled 2020-03-11 (×2): qty 1

## 2020-03-11 NOTE — Evaluation (Signed)
Occupational Therapy Evaluation Patient Details Name: Heather Knapp MRN: [id number] DOB: Apr 27, 1934 Today's Date: 03/11/2020    History of Present Illness Pt is 86 yo female with PMH including thrombocytopenia, osteopenia, memory loss, HTN, hyperlipidemia, breast CA, and CLL.  She was admitted s/p fall with multiple R rib fractures.   Clinical Impression   PTA Pt independent in ADL/IADL, driving, and living alone. UNSURE OF WHO MANAGES MEDICINES/FINANCES. Today Pt is pleasant, cognition with STM deficits and some trouble following commands. She is mod A for LB dressing, min guard assist for transfers with RW, mod A for peri care, mod A for grooming standing at sink, and mod A for UB dressing. Pt will require SNF stay post-acute and continued skilled OT in the acute setting to maximize safety and independence in ADL and functional transfers. Next session to inquire about medicine management and finances and focus on cognitive assessment (pill box test?)    Follow Up Recommendations  SNF;Supervision/Assistance - 24 hour    Equipment Recommendations  3 in 1 bedside commode;Other (comment)(defer to next venue of care)    Recommendations for Other Services       Precautions / Restrictions Precautions Precautions: Fall Restrictions Weight Bearing Restrictions: No      Mobility Bed Mobility Overal bed mobility: Needs Assistance Bed Mobility: Supine to Sit     Supine to sit: Mod assist;HOB elevated     General bed mobility comments: Pt able to walk feet off the bed, and then required mod A for trunk elevation  Transfers Overall transfer level: Needs assistance Equipment used: Rolling walker (2 wheeled) Transfers: Sit to/from Stand Sit to Stand: Min assist         General transfer comment: slightly elevated bed, to and from recliner - vc for hand placement    Balance Overall balance assessment: Needs assistance Sitting-balance support: Bilateral upper extremity  supported;Feet supported Sitting balance-Leahy Scale: Fair     Standing balance support: Bilateral upper extremity supported Standing balance-Leahy Scale: Fair Standing balance comment: improved dynamic balance with RW                           ADL either performed or assessed with clinical judgement   ADL Overall ADL's : Needs assistance/impaired Eating/Feeding: Set up;Sitting   Grooming: Moderate assistance;Wash/dry hands;Wash/dry face;Oral care;Brushing hair;Standing Grooming Details (indicate cue type and reason): sink level, could wash face and brush teeth, but was max A for brushing hair, also had to spit in cup due to decreased ability to bend Upper Body Bathing: Moderate assistance   Lower Body Bathing: Maximal assistance;Sitting/lateral leans   Upper Body Dressing : Maximal assistance;Sitting Upper Body Dressing Details (indicate cue type and reason): assist to feed arms due to rib pain Lower Body Dressing: Moderate assistance Lower Body Dressing Details (indicate cue type and reason): able to bring leg across body in figure 4 method, but very painful Toilet Transfer: Min guard;Minimal assistance;Ambulation;RW;Cueing for safety   Toileting- Clothing Manipulation and Hygiene: Moderate assistance;Sitting/lateral lean       Functional mobility during ADLs: Min guard;Minimal assistance;Cueing for safety;Rolling walker General ADL Comments: pain limiting factor for UB and LB completing ADL.      Vision Baseline Vision/History: Wears glasses Wears Glasses: At all times Patient Visual Report: No change from baseline       Perception     Praxis      Pertinent Vitals/Pain Pain Assessment: Faces Faces Pain Scale: Hurts even more Pain  Location: R ribs with laughing, movement up to face like brushing hair Pain Descriptors / Indicators: Grimacing;Guarding;Sharp     Hand Dominance Right   Extremity/Trunk Assessment Upper Extremity Assessment Upper  Extremity Assessment: Generalized weakness(decreased ROM due to pain in rib fx)   Lower Extremity Assessment Lower Extremity Assessment: Defer to PT evaluation   Cervical / Trunk Assessment Cervical / Trunk Assessment: Other exceptions Cervical / Trunk Exceptions: rib fx impacting comfort   Communication Communication Communication: No difficulties   Cognition Arousal/Alertness: Awake/alert Behavior During Therapy: WFL for tasks assessed/performed Overall Cognitive Status: No family/caregiver present to determine baseline cognitive functioning Area of Impairment: Following commands                     Memory: Decreased short-term memory Following Commands: Follows one step commands with increased time;Follows multi-step commands inconsistently           General Comments       Exercises     Shoulder Instructions      Home Living Family/patient expects to be discharged to:: Private residence Living Arrangements: Alone Available Help at Discharge: Family;Available PRN/intermittently(daughter lives an hour away) Type of Home: House Home Access: Stairs to enter Technical brewer of Steps: 1   Home Layout: Other (Comment)(one step up to kitchen)     Bathroom Shower/Tub: Teacher, early years/pre: Standard                Prior Functioning/Environment Level of Independence: Independent        Comments: Pt independent with ADLs, IADLs, and driving.  Could ambulate in community.  Denies falls other than this fall- when she tripped and fell in carport        OT Problem List: Decreased activity tolerance;Decreased range of motion;Impaired balance (sitting and/or standing);Decreased cognition;Decreased safety awareness;Decreased knowledge of use of DME or AE;Impaired UE functional use;Pain      OT Treatment/Interventions: Self-care/ADL training;DME and/or AE instruction;Therapeutic activities;Patient/family education;Balance training    OT  Goals(Current goals can be found in the care plan section) Acute Rehab OT Goals Patient Stated Goal: regain IND OT Goal Formulation: With patient Time For Goal Achievement: 03/25/20 Potential to Achieve Goals: Good ADL Goals Pt Will Perform Grooming: with modified independence;standing Pt Will Perform Upper Body Dressing: with supervision;sitting Pt Will Perform Lower Body Dressing: with supervision;sit to/from stand Pt Will Transfer to Toilet: with supervision;ambulating Pt Will Perform Toileting - Clothing Manipulation and hygiene: with supervision;sitting/lateral leans  OT Frequency: Min 2X/week   Barriers to D/C: Other (comment)(Pt lives alone. Daughter is very supportive but 1 hr away)          Co-evaluation              AM-PAC OT "6 Clicks" Daily Activity     Outcome Measure Help from another person eating meals?: A Little Help from another person taking care of personal grooming?: A Lot Help from another person toileting, which includes using toliet, bedpan, or urinal?: A Lot Help from another person bathing (including washing, rinsing, drying)?: A Lot Help from another person to put on and taking off regular upper body clothing?: A Lot Help from another person to put on and taking off regular lower body clothing?: A Lot 6 Click Score: 13   End of Session Equipment Utilized During Treatment: Gait belt;Rolling walker Nurse Communication: Mobility status;Precautions  Activity Tolerance: Patient tolerated treatment well Patient left: in chair;with call bell/phone within reach;with chair alarm set  OT Visit Diagnosis:  Unsteadiness on feet (R26.81);Other abnormalities of gait and mobility (R26.89);History of falling (Z91.81);Muscle weakness (generalized) (M62.81);Pain Pain - Right/Left: Right Pain - part of body: (ribs)                Time: [id number] OT Time Calculation (min): 37 min Charges:  OT General Charges $OT Visit: 1 Visit OT Evaluation $OT Eval Moderate  Complexity: [place] OTR/L Acute Rehabilitation Services Pager: [phone] Office: [place] 03/11/2020, 10:41 AM

## 2020-03-11 NOTE — Plan of Care (Signed)
  Problem: Clinical Measurements: Goal: Will remain free from infection Outcome: Progressing Goal: Respiratory complications will improve Outcome: Progressing Goal: Cardiovascular complication will be avoided Outcome: Progressing   Problem: Activity: Goal: Risk for activity intolerance will decrease Outcome: Progressing   Problem: Nutrition: Goal: Adequate nutrition will be maintained Outcome: Progressing   Problem: Elimination: Goal: Will not experience complications related to bowel motility Outcome: Progressing Goal: Will not experience complications related to urinary retention Outcome: Progressing

## 2020-03-11 NOTE — Progress Notes (Signed)
Physical Therapy Treatment Patient Details Name: Heather Knapp MRN: [id number] DOB: Jul 02, 1934 Today's Date: 03/11/2020    History of Present Illness Pt is 86 yo female with PMH including thrombocytopenia, osteopenia, memory loss, HTN, hyperlipidemia, breast CA, and CLL.  She was admitted s/p fall with multiple R rib fractures.    PT Comments    Pt very cooperative this am and tolerating ambulation increased distance in halls but continues to struggle with bed mobility 2* ongoing rib pain.   Follow Up Recommendations  SNF     Equipment Recommendations  Rolling walker with 5" wheels;3in1 (PT)    Recommendations for Other Services       Precautions / Restrictions Precautions Precautions: Fall Restrictions Weight Bearing Restrictions: No    Mobility  Bed Mobility Overal bed mobility: Needs Assistance Bed Mobility: Supine to Sit     Supine to sit: Mod assist;HOB elevated     General bed mobility comments: Pt able to walk feet off the bed, and then required mod A for trunk elevation  Transfers Overall transfer level: Needs assistance Equipment used: Rolling walker (2 wheeled) Transfers: Sit to/from Stand Sit to Stand: Min assist         General transfer comment: slightly elevated bed, to and from recliner - vc for hand placement  Ambulation/Gait Ambulation/Gait assistance: Min assist;+2 safety/equipment(chair follow) Gait Distance (Feet): 180 Feet Assistive device: Rolling walker (2 wheeled) Gait Pattern/deviations: Step-through pattern;Decreased step length - right;Decreased step length - left;Shuffle;Trunk flexed Gait velocity: decreased   General Gait Details: cues for posture and position from [employer]             Wheelchair Mobility    Modified Rankin (Stroke Patients Only)       Balance Overall balance assessment: Needs assistance Sitting-balance support: Bilateral upper extremity supported;Feet supported Sitting balance-Leahy Scale:  Fair     Standing balance support: Bilateral upper extremity supported Standing balance-Leahy Scale: Fair Standing balance comment: improved dynamic balance with RW                            Cognition Arousal/Alertness: Awake/alert Behavior During Therapy: WFL for tasks assessed/performed Overall Cognitive Status: No family/caregiver present to determine baseline cognitive functioning Area of Impairment: Following commands                     Memory: Decreased short-term memory Following Commands: Follows one step commands with increased time;Follows multi-step commands inconsistently              Exercises      General Comments        Pertinent Vitals/Pain Pain Assessment: Faces Faces Pain Scale: Hurts even more Pain Location: R ribs with laughing, movement up to face like brushing hair Pain Descriptors / Indicators: Grimacing;Guarding;Sharp Pain Intervention(s): Limited activity within patient's tolerance;Monitored during session;Premedicated before session;RN gave pain meds during session    Home Living                      Prior Function            PT Goals (current goals can now be found in the care plan section) Acute Rehab PT Goals Patient Stated Goal: regain IND PT Goal Formulation: With patient Time For Goal Achievement: 03/21/20 Potential to Achieve Goals: Good Progress towards PT goals: Progressing toward goals    Frequency    Min 3X/week  PT Plan Current plan remains appropriate    Co-evaluation              AM-PAC PT "6 Clicks" Mobility   Outcome Measure  Help needed turning from your back to your side while in a flat bed without using bedrails?: A Lot Help needed moving from lying on your back to sitting on the side of a flat bed without using bedrails?: A Lot Help needed moving to and from a bed to a chair (including a wheelchair)?: A Little Help needed standing up from a chair using your arms  (e.g., wheelchair or bedside chair)?: A Little Help needed to walk in hospital room?: A Little Help needed climbing 3-5 steps with a railing? : A Lot 6 Click Score: 15    End of Session Equipment Utilized During Treatment: Gait belt Activity Tolerance: Patient limited by pain Patient left: in chair;with call bell/phone within reach;with chair alarm set Nurse Communication: Mobility status PT Visit Diagnosis: History of falling (Z91.81);Unsteadiness on feet (R26.81);Pain Pain - Right/Left: Right     Time: [id number] PT Time Calculation (min) (ACUTE ONLY): 25 min  Charges:  $Gait Training: 8-22 mins                     [place] Pager [phone] Office [phone]    [place] Luft 03/11/2020, 2:08 PM

## 2020-03-11 NOTE — Progress Notes (Signed)
PROGRESS NOTE    Heather Knapp    Code Status: Full Code  [id number] DOB: Sep 03, 1934 DOA: 03/06/2020 LOS: 4 days  PCP: Leighton Ruff, MD CC:  Chief Complaint  Patient presents with  . [facility] Summary   This is an 86 year old female with history of chronic diastolic heart failure, CLL, hypertension, dementia admitted to [facility] on 3/30 following mechanical fall with right-sided back pain and subsequent right-sided rib fractures.  Admitted for pain management.  Currently awaiting SNF placement  A & P   Principal Problem:   Multiple fractures of ribs, right side, initial encounter for closed fracture Active Problems:   CLL (chronic lymphocytic leukemia) (HCC)   Essential hypertension   Chronic diastolic CHF (congestive heart failure) (HCC)   Rib fractures   1. Right sided back pain/ribs 4 through 9 fracture status post mechanical fall from standing height a. Increased pain today.  Will discontinue centrally acting opiates as patient is becoming more confused concerning for hospital-acquired delirium and dementia patient on opiates.  Tramadol for severe pain, ibuprofen for moderate pain (monitor renal function), Tylenol for mild pain b. No pneumothorax on CXR c. Strongly encourage incentive spirometry as rib fractures can lead to atelectasis and pneumonia d. PT recommending SNF  2. Hospital-acquired delirium in dementia patient a. Stable now but overnight was confused and agitated b. Hold off on centrally acting opiates c. Delirium precautions d. Add on Seroquel in the evening  3. Acute hypoxic respiratory failure, suspect secondary to atelectasis in setting of rib fractures a. Has resolved tolerating room air b. Encourage incentive spirometry and proper breathing techniques   4. Hyponatremia, improved, not resolved  5. CLL, Stable  6. Compensated HFpEF  a. I/O, daily weights  7. Hypertension a. Continue Toprol-XL and losartan  8. Dementia a. Lives  alone b. Continue Aricept c. Delirium precautions  DVT prophylaxis: Lovenox, SCDs Family Communication: Discussed with daughter at bedside yesterday Disposition Plan:   Patient came from:   Home                                                                                          Anticipated d/c place: SNF  Barriers to d/c: awaiting SNF placement, hopeful discharge tomorrow  Pressure injury documentation    None  Consultants  None  Procedures  None  Antibiotics   Anti-infectives (From admission, onward)   None        Subjective   Patient confused yesterday and in the evening per nursing and trying to get out of bed.  Patient complaining of right-sided back pain improved but similar to presenting symptoms in area of broken ribs.  No other issues  Objective   Vitals:   03/10/20 0429 03/10/20 1341 03/10/20 2053 03/11/20 0448  BP: (!) 172/69 (!) 141/60 (!) 153/63 140/61  Pulse: 73 63 67 64  Resp: 16 16 16 16   Temp: 98.6 F (37 C) (!) 97.3 F (36.3 C) 98.1 F (36.7 C) (!) 97.4 F (36.3 C)  TempSrc:   Oral Oral  SpO2: 96% 100% 95% 95%  Weight:  Height:        Intake/Output Summary (Last 24 hours) at 03/11/2020 1117 Last data filed at 03/11/2020 1000 Gross per 24 hour  Intake 480 ml  Output 602 ml  Net -122 ml   Filed Weights   03/08/20 0502 03/08/20 1019  Weight: 72.3 kg 72.3 kg    Examination:  Physical Exam Vitals and nursing note reviewed.  Constitutional:      Appearance: Normal appearance.  HENT:     Head: Normocephalic and atraumatic.  Eyes:     Conjunctiva/sclera: Conjunctivae normal.  Cardiovascular:     Rate and Rhythm: Normal rate and regular rhythm.  Pulmonary:     Effort: No respiratory distress.     Comments: Poor inspiratory effort due to pain Abdominal:     General: Abdomen is flat.     Palpations: Abdomen is soft.  Musculoskeletal:     Comments: Right-sided back pain with inspiration  Skin:    Coloration: Skin is not  jaundiced or pale.  Neurological:     Mental Status: She is alert. Mental status is at baseline.  Psychiatric:        Mood and Affect: Mood normal.        Behavior: Behavior normal.     Data Reviewed: I have personally reviewed following labs and imaging studies  CBC: Recent Labs  Lab 03/06/20 2139 03/07/20 0449 03/07/20 1813 03/08/20 0427  WBC 20.6* 18.2* 17.0* 16.4*  NEUTROABS  --  4.6  --   --   HGB 14.2 12.6 13.2 12.1  HCT 41.9 38.5 39.4 36.0  MCV 100.5* 100.5* 100.3* 100.3*  PLT 103* 106* 101* 92*   Basic Metabolic Panel: Recent Labs  Lab 03/06/20 2139 03/06/20 2139 03/07/20 0449 03/07/20 1813 03/08/20 0427 03/09/20 0858 03/11/20 0446  NA 135  --  135  --  129* 132* 133*  K 4.1  --  4.8  --  4.1 4.1 3.8  CL 101  --  101  --  100 98 101  CO2 23  --  27  --  22 22 25   GLUCOSE 119*  --  135*  --  115* 162* 97  BUN 22  --  24*  --  18 16 13   CREATININE 0.73   < > 0.78 0.72 0.64 0.78 0.60  CALCIUM 9.8  --  9.3  --  8.4* 8.8* 8.3*  MG 2.2  --  2.1  --   --   --   --    < > = values in this interval not displayed.   GFR: Estimated Creatinine Clearance: 51.9 mL/min (by C-G formula based on SCr of 0.6 mg/dL). Liver Function Tests: Recent Labs  Lab 03/06/20 2139  AST 26  ALT 22  ALKPHOS 36*  BILITOT 1.0  PROT 6.3*  ALBUMIN 4.3   No results for input(s): LIPASE, AMYLASE in the last 168 hours. No results for input(s): AMMONIA in the last 168 hours. Coagulation Profile: No results for input(s): INR, PROTIME in the last 168 hours. Cardiac Enzymes: No results for input(s): CKTOTAL, CKMB, CKMBINDEX, TROPONINI in the last 168 hours. BNP (last 3 results) No results for input(s): PROBNP in the last 8760 hours. HbA1C: No results for input(s): HGBA1C in the last 72 hours. CBG: No results for input(s): GLUCAP in the last 168 hours. Lipid Profile: No results for input(s): CHOL, HDL, LDLCALC, TRIG, CHOLHDL, LDLDIRECT in the last 72 hours. Thyroid Function  Tests: No results for input(s): TSH, T4TOTAL, FREET4, T3FREE, THYROIDAB  in the last 72 hours. Anemia Panel: No results for input(s): VITAMINB12, FOLATE, FERRITIN, TIBC, IRON, RETICCTPCT in the last 72 hours. Sepsis Labs: No results for input(s): PROCALCITON, LATICACIDVEN in the last 168 hours.  Recent Results (from the past 240 hour(s))  SARS CORONAVIRUS 2 (TAT 6-24 HRS) Nasopharyngeal Nasopharyngeal Swab     Status: None   Collection Time: 03/06/20  9:39 PM   Specimen: Nasopharyngeal Swab  Result Value Ref Range Status   SARS Coronavirus 2 NEGATIVE NEGATIVE Final    Comment: (NOTE) SARS-CoV-2 target nucleic acids are NOT DETECTED. The SARS-CoV-2 RNA is generally detectable in upper and lower respiratory specimens during the acute phase of infection. Negative results do not preclude SARS-CoV-2 infection, do not rule out co-infections with other pathogens, and should not be used as the sole basis for treatment or other patient management decisions. Negative results must be combined with clinical observations, patient history, and epidemiological information. The expected result is Negative. Fact Sheet for Patients: SugarRoll.be Fact Sheet for Healthcare Providers: https://www.woods-mathews.com/ This test is not yet approved or cleared by the [place] FDA and  has been authorized for detection and/or diagnosis of SARS-CoV-2 by FDA under an Emergency Use Authorization (EUA). This EUA will remain  in effect (meaning this test can be used) for the duration of the COVID-19 declaration under Section 56 4(b)(1) of the Act, 21 U.S.C. section 360bbb-3(b)(1), unless the authorization is terminated or revoked sooner. Performed at [facility], [place] [address] [place], [place] [address]          Radiology Studies: No results found.      Scheduled Meds: . acetaminophen  650 mg Oral Q8H  . amLODipine  5 mg Oral Daily  .  donepezil  10 mg Oral QHS  . enoxaparin (LOVENOX) injection  40 mg Subcutaneous Q24H  . losartan  25 mg Oral Daily  . memantine  10 mg Oral BID  . metoprolol succinate  100 mg Oral Daily  . multivitamin with minerals  1 tablet Oral Daily  . polyethylene glycol  17 g Oral Daily  . sodium chloride flush  3 mL Intravenous Q12H   Continuous Infusions:    Time spent: 20 minutes with over 50% of the time coordinating the patient's care    Harold Hedge, DO [facility] Pager [phone]  Call night coverage person covering after 7pm

## 2020-03-12 LAB — CBC
HCT: 38 % (ref 36.0–46.0)
Hemoglobin: 12.8 g/dL (ref 12.0–15.0)
MCH: 33 pg (ref 26.0–34.0)
MCHC: 33.7 g/dL (ref 30.0–36.0)
MCV: 97.9 fL (ref 80.0–100.0)
Platelets: 122 10*3/uL — ABNORMAL LOW (ref 150–400)
RBC: 3.88 MIL/uL (ref 3.87–5.11)
RDW: 12.9 % (ref 11.5–15.5)
WBC: 22.9 10*3/uL — ABNORMAL HIGH (ref 4.0–10.5)
nRBC: 0 % (ref 0.0–0.2)

## 2020-03-12 LAB — BASIC METABOLIC PANEL
Anion gap: 10 (ref 5–15)
BUN: 15 mg/dL (ref 8–23)
CO2: 23 mmol/L (ref 22–32)
Calcium: 8.9 mg/dL (ref 8.9–10.3)
Chloride: 100 mmol/L (ref 98–111)
Creatinine, Ser: 0.48 mg/dL (ref 0.44–1.00)
GFR calc Af Amer: >60 mL/min (ref >60)
GFR calc non Af Amer: >60 mL/min (ref >60)
Glucose, Bld: 103 mg/dL — ABNORMAL HIGH (ref 70–99)
Potassium: 4.3 mmol/L (ref 3.5–5.1)
Sodium: 133 mmol/L — ABNORMAL LOW (ref 135–145)

## 2020-03-12 MED ORDER — POLYETHYLENE GLYCOL 3350 17 G PO PACK: 17.0000 g | PACK | Freq: Every day | ORAL | 0 refills | Status: DC

## 2020-03-12 MED ORDER — HYDROCODONE-ACETAMINOPHEN 5-325 MG PO TABS: 1.0000 | ORAL_TABLET | Freq: Four times a day (QID) | ORAL | 0 refills | Status: AC | PRN

## 2020-03-12 MED ORDER — AMLODIPINE BESYLATE 5 MG PO TABS: 5.0000 mg | ORAL_TABLET | Freq: Every day | ORAL | 0 refills | Status: DC

## 2020-03-12 MED ORDER — ACETAMINOPHEN 325 MG PO TABS: 650.0000 mg | ORAL_TABLET | Freq: Three times a day (TID) | ORAL | 0 refills | Status: AC

## 2020-03-12 MED ORDER — IBUPROFEN 600 MG PO TABS: 600.0000 mg | ORAL_TABLET | Freq: Four times a day (QID) | ORAL | 0 refills | Status: AC | PRN

## 2020-03-12 MED ORDER — METHOCARBAMOL 500 MG PO TABS: 500.0000 mg | ORAL_TABLET | Freq: Four times a day (QID) | ORAL | 0 refills | Status: AC | PRN

## 2020-03-12 NOTE — Progress Notes (Signed)
Discharge instructions given to patients daughter [name] and all questions were answered.

## 2020-03-12 NOTE — TOC Transition Note (Signed)
Transition of Care [facility]) - CM/SW Discharge Note   Patient Details  Name: Heather Knapp MRN: [id number] Date of Birth: 1934-06-01  [facility]) CM/SW Contact:  Lia Hopping, [place] Phone Number: 03/12/2020, 9:48 AM   Clinical Narrative:    Patient daughter [name] reports the patient has progressed well enough with Physical Therapy to go home. The patient will go home with her daughter in [place], [place] and transition home when she is more independent.  Daughter has requested home health agencies in [place], [place] to follow up with once the patient has return home. CSW provided a Home Health  list from [employer].  CSW notified the facility Rep. Kerri.    Final next level of care: Home/Self Care Barriers to Discharge: Barriers Resolved   Patient Goals and CMS Choice   CMS Medicare.gov Compare Post Acute Care list provided to:: Other (Comment Required)(Daughter) Choice offered to / list presented to : Adult Children  Discharge Placement                       Discharge Plan and Services In-house Referral: Clinical Social Work Discharge Planning Services: CM Consult Post Acute Care Choice: Home Health                    HH Arranged: PT, OT [facility] Agency: [place] ([facility]) Date [place]: 03/07/20 Time [place]: [id number] Representative spoke with at [place]: [place] ([place]) Interventions     Readmission Risk Interventions No flowsheet data found.

## 2020-03-12 NOTE — Plan of Care (Signed)

## 2020-03-12 NOTE — Discharge Summary (Signed)
Physician Discharge Summary  Heather Knapp [id number] DOB: Jan 01, 1934   PCP: Leighton Ruff, MD  Admit date: 03/06/2020 Discharge date: 03/12/2020 Length of Stay: 4 days   Code Status: Full Code  Admitted From:  Home Discharged to:   Heather Knapp: PT/OT  Equipment/Devices:  None Discharge Condition:  Stable  Recommendations for Outpatient Follow-up   1. Follow up with PCP in 1 week 2. Follow up BMP/CBC  3. Follow up on pain from rib fractures 4. Started amlodipine - BP may be elevated due to pain, reassess BP meds once pain is resolved   Hospital Summary  This is an 86 year old female with history of chronic diastolic heart failure, CLL, hypertension, dementia admitted to [facility] on 3/30 following mechanical fall with right-sided back pain and subsequent right-sided ribs 4-9 fractures.  Admitted for pain management.  Initially was awaiting SNF placement however on 4/5 family wished to take patient home with home health services.  She was discharged with pain regimen and recommended follow-up with PCP  A & P   Principal Problem:   Multiple fractures of ribs, right side, initial encounter for closed fracture Active Problems:   CLL (chronic lymphocytic leukemia) (HCC)   Essential hypertension   Chronic diastolic CHF (congestive heart failure) (HCC)   Rib fractures    1. Right sided back pain/ribs 4 through 9 fracture status post mechanical fall from standing height a. Pain poorly controlled with tramadol, changed to Norco and advised family to monitor for confusion.  Continue Tylenol standing, ibuprofen as needed, Robaxin as needed b. No pneumothorax on CXR c. Strongly encourage incentive spirometry as rib fractures can lead to atelectasis and pneumonia d. PT recommended SNF, family wishes to take patient home today with home health services. e. Follow-up with PCP  2. Hospital-acquired delirium in dementia patient, resolved a. Resolved with holding oxycodone but due  to poor pain control with tramadol she was discharged with Norco as above  3. Acute hypoxic respiratory failure, suspect secondary to atelectasis in setting of rib fractures a. Has resolved and has been tolerating room air b. Encourage incentive spirometry and proper breathing techniques   4. Hyponatremia, improved, not resolved  5. CLL, Stable  6. Thrombocytopenia probably reactive, resolved  6. Compensated HFpEF  a. I/O, daily weights  7. Hypertension a. Continue Toprol-XL and losartan  8. Dementia a. At baseline    Consultants  . None  Procedures  . None  Antibiotics   Anti-infectives (From admission, onward)   None       Subjective  States that her pain is increased from yesterday and tramadol does not help.  She otherwise feels well Without shortness of breath  Denies any chest pain, fever, nausea, vomiting.  Admits to right-sided back pain otherwise ROS negative    Objective   Discharge Exam: Vitals:   03/11/20 2257 03/12/20 0528  BP: (!) 141/58 (!) 170/77  Pulse: 65 95  Resp: 16 16  Temp: 98.5 F (36.9 C) 98.5 F (36.9 C)  SpO2: 92% 96%   Vitals:   03/11/20 0448 03/11/20 1321 03/11/20 2257 03/12/20 0528  BP: 140/61 135/68 (!) 141/58 (!) 170/77  Pulse: 64 64 65 95  Resp: 16  16 16   Temp: (!) 97.4 F (36.3 C) 97.6 F (36.4 C) 98.5 F (36.9 C) 98.5 F (36.9 C)  TempSrc: Oral     SpO2: 95% 94% 92% 96%  Weight:      Height:        Physical Exam  Vitals and nursing note reviewed.  Constitutional:      Appearance: Normal appearance.  HENT:     Head: Normocephalic and atraumatic.  Eyes:     Conjunctiva/sclera: Conjunctivae normal.  Cardiovascular:     Rate and Rhythm: Normal rate and regular rhythm.  Pulmonary:     Effort: Pulmonary effort is normal. No respiratory distress.     Breath sounds: Normal breath sounds. No wheezing.  Abdominal:     General: Abdomen is flat.     Palpations: Abdomen is soft.  Musculoskeletal:         General: No swelling or tenderness.  Skin:    Coloration: Skin is not jaundiced or pale.  Neurological:     Mental Status: She is alert. Mental status is at baseline.  Psychiatric:        Mood and Affect: Mood normal.        Behavior: Behavior normal.       The results of significant diagnostics from this hospitalization (including imaging, microbiology, ancillary and laboratory) are listed below for reference.     Microbiology: Recent Results (from the past 240 hour(s))  SARS CORONAVIRUS 2 (TAT 6-24 HRS) Nasopharyngeal Nasopharyngeal Swab     Status: None   Collection Time: 03/06/20  9:39 PM   Specimen: Nasopharyngeal Swab  Result Value Ref Range Status   SARS Coronavirus 2 NEGATIVE NEGATIVE Final    Comment: (NOTE) SARS-CoV-2 target nucleic acids are NOT DETECTED. The SARS-CoV-2 RNA is generally detectable in upper and lower respiratory specimens during the acute phase of infection. Negative results do not preclude SARS-CoV-2 infection, do not rule out co-infections with other pathogens, and should not be used as the sole basis for treatment or other patient management decisions. Negative results must be combined with clinical observations, patient history, and epidemiological information. The expected result is Negative. Fact Sheet for Patients: SugarRoll.be Fact Sheet for Healthcare Providers: https://www.woods-mathews.com/ This test is not yet approved or cleared by the [place] FDA and  has been authorized for detection and/or diagnosis of SARS-CoV-2 by FDA under an Emergency Use Authorization (EUA). This EUA will remain  in effect (meaning this test can be used) for the duration of the COVID-19 declaration under Section 56 4(b)(1) of the Act, 21 U.S.C. section 360bbb-3(b)(1), unless the authorization is terminated or revoked sooner. Performed at [facility], [place] [address] [place], [place] [address]       Labs: BNP (last 3 results) No results for input(s): BNP in the last 8760 hours. Basic Metabolic Panel: Recent Labs  Lab 03/06/20 2139 03/06/20 2139 03/07/20 0449 03/07/20 0449 03/07/20 1813 03/08/20 0427 03/09/20 0858 03/11/20 0446 03/12/20 0454  NA 135   < > 135  --   --  129* 132* 133* 133*  K 4.1   < > 4.8  --   --  4.1 4.1 3.8 4.3  CL 101   < > 101  --   --  100 98 101 100  CO2 23   < > 27  --   --  22 22 25 23   GLUCOSE 119*   < > 135*  --   --  115* 162* 97 103*  BUN 22   < > 24*  --   --  18 16 13 15   CREATININE 0.73   < > 0.78   < > 0.72 0.64 0.78 0.60 0.48  CALCIUM 9.8   < > 9.3  --   --  8.4* 8.8* 8.3*  8.9  MG 2.2  --  2.1  --   --   --   --   --   --    < > = values in this interval not displayed.   Liver Function Tests: Recent Labs  Lab 03/06/20 2139  AST 26  ALT 22  ALKPHOS 36*  BILITOT 1.0  PROT 6.3*  ALBUMIN 4.3   No results for input(s): LIPASE, AMYLASE in the last 168 hours. No results for input(s): AMMONIA in the last 168 hours. CBC: Recent Labs  Lab 03/06/20 2139 03/07/20 0449 03/07/20 1813 03/08/20 0427 03/12/20 0454  WBC 20.6* 18.2* 17.0* 16.4* 22.9*  NEUTROABS  --  4.6  --   --   --   HGB 14.2 12.6 13.2 12.1 12.8  HCT 41.9 38.5 39.4 36.0 38.0  MCV 100.5* 100.5* 100.3* 100.3* 97.9  PLT 103* 106* 101* 92* 122*   Cardiac Enzymes: No results for input(s): CKTOTAL, CKMB, CKMBINDEX, TROPONINI in the last 168 hours. BNP: Invalid input(s): POCBNP CBG: No results for input(s): GLUCAP in the last 168 hours. D-Dimer No results for input(s): DDIMER in the last 72 hours. Hgb A1c No results for input(s): HGBA1C in the last 72 hours. Lipid Profile No results for input(s): CHOL, HDL, LDLCALC, TRIG, CHOLHDL, LDLDIRECT in the last 72 hours. Thyroid function studies No results for input(s): TSH, T4TOTAL, T3FREE, THYROIDAB in the last 72 hours.  Invalid input(s): FREET3 Anemia work up No results for input(s): VITAMINB12, FOLATE, FERRITIN,  TIBC, IRON, RETICCTPCT in the last 72 hours. Urinalysis    Component Value Date/Time   COLORURINE YELLOW 03/07/2020 1237   APPEARANCEUR CLEAR 03/07/2020 1237   LABSPEC 1.023 03/07/2020 1237   PHURINE 6.0 03/07/2020 1237   GLUCOSEU NEGATIVE 03/07/2020 1237   HGBUR NEGATIVE 03/07/2020 1237   BILIRUBINUR NEGATIVE 03/07/2020 1237   KETONESUR 5 (A) 03/07/2020 1237   PROTEINUR NEGATIVE 03/07/2020 1237   UROBILINOGEN 1.0 07/14/2015 1020   NITRITE NEGATIVE 03/07/2020 1237   LEUKOCYTESUR NEGATIVE 03/07/2020 1237   Sepsis Labs Invalid input(s): PROCALCITONIN,  WBC,  LACTICIDVEN Microbiology Recent Results (from the past 240 hour(s))  SARS CORONAVIRUS 2 (TAT 6-24 HRS) Nasopharyngeal Nasopharyngeal Swab     Status: None   Collection Time: 03/06/20  9:39 PM   Specimen: Nasopharyngeal Swab  Result Value Ref Range Status   SARS Coronavirus 2 NEGATIVE NEGATIVE Final    Comment: (NOTE) SARS-CoV-2 target nucleic acids are NOT DETECTED. The SARS-CoV-2 RNA is generally detectable in upper and lower respiratory specimens during the acute phase of infection. Negative results do not preclude SARS-CoV-2 infection, do not rule out co-infections with other pathogens, and should not be used as the sole basis for treatment or other patient management decisions. Negative results must be combined with clinical observations, patient history, and epidemiological information. The expected result is Negative. Fact Sheet for Patients: SugarRoll.be Fact Sheet for Healthcare Providers: https://www.woods-mathews.com/ This test is not yet approved or cleared by the [place] FDA and  has been authorized for detection and/or diagnosis of SARS-CoV-2 by FDA under an Emergency Use Authorization (EUA). This EUA will remain  in effect (meaning this test can be used) for the duration of the COVID-19 declaration under Section 56 4(b)(1) of the Act, 21 U.S.C. section  360bbb-3(b)(1), unless the authorization is terminated or revoked sooner. Performed at [facility], [place] [address] [place], [place] [address]     Discharge Instructions     Discharge Instructions    Diet - low  sodium heart healthy   Complete by: As directed    Discharge instructions   Complete by: As directed    You were seen and examined in the hospital for rib fractures and cared for by a hospitalist.   Upon Discharge:  - Take Tylenol 650 every 8 hours - Take Ibuprofen 600 mg every 6 hours as needed for moderate pain for the next 7 days - Take Norco every 6-8 hours as needed for severe pain  - Take Methocarbamol 500 mg every 6 hours as needed for muscle spasms - Start Amlodipine for elevated blood pressure, reevaluate the need for this medication at your next doctor's appointment - If you experience confusion on these medications then stop them and talk to your doctor - Follow up with your PCP  with lab work in the next 1-2 weeks Bring all home medications to your appointment to review Request that your primary physician go over all hospital tests and procedures/radiological results at the follow up.   Please get all hospital records sent to your physician by signing a hospital release before you go home.   Read the complete instructions along with all the possible side effects for all the medicines you take and that have been prescribed to you. Take any new medicines after you have completely understood and accept all the possible adverse reactions/side effects.   If you have any questions about your discharge medications or the care you received while you were in the hospital, you can call the unit and asked to speak with the hospitalist on call. Once you are discharged, your primary care physician will handle any further medical issues. Please note that NO REFILLS for any discharge medications will be authorized, as it is imperative that you return to your primary care  physician (or establish a relationship with a primary care physician if you do not have one) for your aftercare needs so that they can reassess your need for medications and monitor your lab values.   Do not drive, operate heavy machinery, perform activities at heights, swimming or participation in water activities or provide baby sitting services if your were admitted for loss of consciousness/seizures or if you are on sedating medications including, but not limited to benzodiazepines, sleep medications, narcotic pain medications, etc., until you have been cleared to do so by a medical doctor.   Do not take more than prescribed medications.   Wear a seat belt while driving.  If you have smoked or chewed Tobacco in the last 2 years please stop smoking; also stop any regular Alcohol and/or any Recreational drug use including marijuana.  If you experience worsening of your admission symptoms or develop shortness of breath, chest pain, suicidal or homicidal thoughts or experience a life threatening emergency, you must seek medical attention immediately by calling 911 or calling your PCP immediately.   Increase activity slowly   Complete by: As directed      Allergies as of 03/12/2020   No Known Allergies     Medication List    TAKE these medications   acetaminophen 325 MG tablet Commonly known as: TYLENOL Take 2 tablets (650 mg total) by mouth every 8 (eight) hours.   amLODipine 5 MG tablet Commonly known as: NORVASC Take 1 tablet (5 mg total) by mouth daily.   donepezil 10 MG tablet Commonly known as: ARICEPT Take 1 tablet (10 mg total) by mouth at bedtime.   FISH OIL PO Take 1,200 mg by mouth daily.   HYDROcodone-acetaminophen  5-325 MG tablet Commonly known as: NORCO/VICODIN Take 1 tablet by mouth every 6 (six) hours as needed for up to 5 days for severe pain.   ibuprofen 600 MG tablet Commonly known as: ADVIL Take 1 tablet (600 mg total) by mouth every 6 (six) hours as  needed for up to 15 days for moderate pain.   losartan 25 MG tablet Commonly known as: COZAAR Take 1 tablet (25 mg total) by mouth daily.   memantine 10 MG tablet Commonly known as: NAMENDA TAKE 1 TABLET BY MOUTH TWICE A DAY   methocarbamol 500 MG tablet Commonly known as: ROBAXIN Take 1 tablet (500 mg total) by mouth every 6 (six) hours as needed for up to 15 days for muscle spasms.   multivitamin with minerals tablet Take 1 tablet by mouth daily.   polyethylene glycol 17 g packet Commonly known as: MIRALAX / GLYCOLAX Take 17 g by mouth daily.   Toprol XL 100 MG 24 hr tablet Generic drug: metoprolol succinate Take 100 mg by mouth Daily.      Contact information for after-discharge care    [facility] .   Service: Skilled Nursing Contact information: [address] [place] [phone]             No Known Allergies  Time coordinating discharge: Over 30 minutes   SIGNED:   Harold Hedge, D.O. [facility] Pager: [phone]  03/12/2020, 4:26 PM

## 2020-03-22 DIAGNOSIS — D696 Thrombocytopenia, unspecified: Secondary | ICD-10-CM | POA: Diagnosis not present

## 2020-03-22 DIAGNOSIS — E871 Hypo-osmolality and hyponatremia: Secondary | ICD-10-CM | POA: Diagnosis not present

## 2020-03-22 DIAGNOSIS — S2239XA Fracture of one rib, unspecified side, initial encounter for closed fracture: Secondary | ICD-10-CM | POA: Diagnosis not present

## 2020-03-22 DIAGNOSIS — C9110 Chronic lymphocytic leukemia of B-cell type not having achieved remission: Secondary | ICD-10-CM | POA: Diagnosis not present

## 2020-03-22 DIAGNOSIS — R413 Other amnesia: Secondary | ICD-10-CM | POA: Diagnosis not present

## 2020-03-29 DIAGNOSIS — L812 Freckles: Secondary | ICD-10-CM | POA: Diagnosis not present

## 2020-03-29 DIAGNOSIS — L821 Other seborrheic keratosis: Secondary | ICD-10-CM | POA: Diagnosis not present

## 2020-03-30 DIAGNOSIS — C9110 Chronic lymphocytic leukemia of B-cell type not having achieved remission: Secondary | ICD-10-CM | POA: Diagnosis not present

## 2020-03-30 DIAGNOSIS — R69 Illness, unspecified: Secondary | ICD-10-CM | POA: Diagnosis not present

## 2020-03-30 DIAGNOSIS — M8580 Other specified disorders of bone density and structure, unspecified site: Secondary | ICD-10-CM | POA: Diagnosis not present

## 2020-03-30 DIAGNOSIS — R32 Unspecified urinary incontinence: Secondary | ICD-10-CM | POA: Diagnosis not present

## 2020-03-30 DIAGNOSIS — I110 Hypertensive heart disease with heart failure: Secondary | ICD-10-CM | POA: Diagnosis not present

## 2020-03-30 DIAGNOSIS — S2241XD Multiple fractures of ribs, right side, subsequent encounter for fracture with routine healing: Secondary | ICD-10-CM | POA: Diagnosis not present

## 2020-03-30 DIAGNOSIS — I051 Rheumatic mitral insufficiency: Secondary | ICD-10-CM | POA: Diagnosis not present

## 2020-03-30 DIAGNOSIS — K5790 Diverticulosis of intestine, part unspecified, without perforation or abscess without bleeding: Secondary | ICD-10-CM | POA: Diagnosis not present

## 2020-03-30 DIAGNOSIS — I5032 Chronic diastolic (congestive) heart failure: Secondary | ICD-10-CM | POA: Diagnosis not present

## 2020-03-30 DIAGNOSIS — E785 Hyperlipidemia, unspecified: Secondary | ICD-10-CM | POA: Diagnosis not present

## 2020-04-02 DIAGNOSIS — I051 Rheumatic mitral insufficiency: Secondary | ICD-10-CM | POA: Diagnosis not present

## 2020-04-02 DIAGNOSIS — C9110 Chronic lymphocytic leukemia of B-cell type not having achieved remission: Secondary | ICD-10-CM | POA: Diagnosis not present

## 2020-04-02 DIAGNOSIS — R32 Unspecified urinary incontinence: Secondary | ICD-10-CM | POA: Diagnosis not present

## 2020-04-02 DIAGNOSIS — E785 Hyperlipidemia, unspecified: Secondary | ICD-10-CM | POA: Diagnosis not present

## 2020-04-02 DIAGNOSIS — I5032 Chronic diastolic (congestive) heart failure: Secondary | ICD-10-CM | POA: Diagnosis not present

## 2020-04-02 DIAGNOSIS — I110 Hypertensive heart disease with heart failure: Secondary | ICD-10-CM | POA: Diagnosis not present

## 2020-04-02 DIAGNOSIS — S2241XD Multiple fractures of ribs, right side, subsequent encounter for fracture with routine healing: Secondary | ICD-10-CM | POA: Diagnosis not present

## 2020-04-02 DIAGNOSIS — M8580 Other specified disorders of bone density and structure, unspecified site: Secondary | ICD-10-CM | POA: Diagnosis not present

## 2020-04-02 DIAGNOSIS — K5790 Diverticulosis of intestine, part unspecified, without perforation or abscess without bleeding: Secondary | ICD-10-CM | POA: Diagnosis not present

## 2020-04-02 DIAGNOSIS — R69 Illness, unspecified: Secondary | ICD-10-CM | POA: Diagnosis not present

## 2020-04-06 DIAGNOSIS — I051 Rheumatic mitral insufficiency: Secondary | ICD-10-CM | POA: Diagnosis not present

## 2020-04-06 DIAGNOSIS — R69 Illness, unspecified: Secondary | ICD-10-CM | POA: Diagnosis not present

## 2020-04-06 DIAGNOSIS — R32 Unspecified urinary incontinence: Secondary | ICD-10-CM | POA: Diagnosis not present

## 2020-04-06 DIAGNOSIS — E785 Hyperlipidemia, unspecified: Secondary | ICD-10-CM | POA: Diagnosis not present

## 2020-04-06 DIAGNOSIS — M8580 Other specified disorders of bone density and structure, unspecified site: Secondary | ICD-10-CM | POA: Diagnosis not present

## 2020-04-06 DIAGNOSIS — I110 Hypertensive heart disease with heart failure: Secondary | ICD-10-CM | POA: Diagnosis not present

## 2020-04-06 DIAGNOSIS — K5790 Diverticulosis of intestine, part unspecified, without perforation or abscess without bleeding: Secondary | ICD-10-CM | POA: Diagnosis not present

## 2020-04-06 DIAGNOSIS — S2241XD Multiple fractures of ribs, right side, subsequent encounter for fracture with routine healing: Secondary | ICD-10-CM | POA: Diagnosis not present

## 2020-04-06 DIAGNOSIS — I5032 Chronic diastolic (congestive) heart failure: Secondary | ICD-10-CM | POA: Diagnosis not present

## 2020-04-06 DIAGNOSIS — C9110 Chronic lymphocytic leukemia of B-cell type not having achieved remission: Secondary | ICD-10-CM | POA: Diagnosis not present

## 2020-04-12 DIAGNOSIS — S2241XD Multiple fractures of ribs, right side, subsequent encounter for fracture with routine healing: Secondary | ICD-10-CM | POA: Diagnosis not present

## 2020-04-12 DIAGNOSIS — M8580 Other specified disorders of bone density and structure, unspecified site: Secondary | ICD-10-CM | POA: Diagnosis not present

## 2020-04-12 DIAGNOSIS — C9110 Chronic lymphocytic leukemia of B-cell type not having achieved remission: Secondary | ICD-10-CM | POA: Diagnosis not present

## 2020-04-12 DIAGNOSIS — I051 Rheumatic mitral insufficiency: Secondary | ICD-10-CM | POA: Diagnosis not present

## 2020-04-12 DIAGNOSIS — R69 Illness, unspecified: Secondary | ICD-10-CM | POA: Diagnosis not present

## 2020-04-12 DIAGNOSIS — R32 Unspecified urinary incontinence: Secondary | ICD-10-CM | POA: Diagnosis not present

## 2020-04-12 DIAGNOSIS — I5032 Chronic diastolic (congestive) heart failure: Secondary | ICD-10-CM | POA: Diagnosis not present

## 2020-04-12 DIAGNOSIS — E785 Hyperlipidemia, unspecified: Secondary | ICD-10-CM | POA: Diagnosis not present

## 2020-04-12 DIAGNOSIS — K5790 Diverticulosis of intestine, part unspecified, without perforation or abscess without bleeding: Secondary | ICD-10-CM | POA: Diagnosis not present

## 2020-04-12 DIAGNOSIS — I110 Hypertensive heart disease with heart failure: Secondary | ICD-10-CM | POA: Diagnosis not present

## 2020-04-16 DIAGNOSIS — I051 Rheumatic mitral insufficiency: Secondary | ICD-10-CM | POA: Diagnosis not present

## 2020-04-16 DIAGNOSIS — R69 Illness, unspecified: Secondary | ICD-10-CM | POA: Diagnosis not present

## 2020-04-16 DIAGNOSIS — M8580 Other specified disorders of bone density and structure, unspecified site: Secondary | ICD-10-CM | POA: Diagnosis not present

## 2020-04-16 DIAGNOSIS — C9110 Chronic lymphocytic leukemia of B-cell type not having achieved remission: Secondary | ICD-10-CM | POA: Diagnosis not present

## 2020-04-16 DIAGNOSIS — S2241XD Multiple fractures of ribs, right side, subsequent encounter for fracture with routine healing: Secondary | ICD-10-CM | POA: Diagnosis not present

## 2020-04-16 DIAGNOSIS — I5032 Chronic diastolic (congestive) heart failure: Secondary | ICD-10-CM | POA: Diagnosis not present

## 2020-04-16 DIAGNOSIS — I110 Hypertensive heart disease with heart failure: Secondary | ICD-10-CM | POA: Diagnosis not present

## 2020-04-16 DIAGNOSIS — K5790 Diverticulosis of intestine, part unspecified, without perforation or abscess without bleeding: Secondary | ICD-10-CM | POA: Diagnosis not present

## 2020-04-16 DIAGNOSIS — R32 Unspecified urinary incontinence: Secondary | ICD-10-CM | POA: Diagnosis not present

## 2020-04-16 DIAGNOSIS — E785 Hyperlipidemia, unspecified: Secondary | ICD-10-CM | POA: Diagnosis not present

## 2020-04-26 DIAGNOSIS — R69 Illness, unspecified: Secondary | ICD-10-CM | POA: Diagnosis not present

## 2020-04-26 DIAGNOSIS — M8580 Other specified disorders of bone density and structure, unspecified site: Secondary | ICD-10-CM | POA: Diagnosis not present

## 2020-04-26 DIAGNOSIS — I110 Hypertensive heart disease with heart failure: Secondary | ICD-10-CM | POA: Diagnosis not present

## 2020-04-26 DIAGNOSIS — K5790 Diverticulosis of intestine, part unspecified, without perforation or abscess without bleeding: Secondary | ICD-10-CM | POA: Diagnosis not present

## 2020-04-26 DIAGNOSIS — R32 Unspecified urinary incontinence: Secondary | ICD-10-CM | POA: Diagnosis not present

## 2020-04-26 DIAGNOSIS — I051 Rheumatic mitral insufficiency: Secondary | ICD-10-CM | POA: Diagnosis not present

## 2020-04-26 DIAGNOSIS — S2241XD Multiple fractures of ribs, right side, subsequent encounter for fracture with routine healing: Secondary | ICD-10-CM | POA: Diagnosis not present

## 2020-04-26 DIAGNOSIS — E785 Hyperlipidemia, unspecified: Secondary | ICD-10-CM | POA: Diagnosis not present

## 2020-04-26 DIAGNOSIS — I5032 Chronic diastolic (congestive) heart failure: Secondary | ICD-10-CM | POA: Diagnosis not present

## 2020-04-26 DIAGNOSIS — C9110 Chronic lymphocytic leukemia of B-cell type not having achieved remission: Secondary | ICD-10-CM | POA: Diagnosis not present

## 2020-05-09 DIAGNOSIS — R69 Illness, unspecified: Secondary | ICD-10-CM | POA: Diagnosis not present

## 2020-06-07 DIAGNOSIS — R69 Illness, unspecified: Secondary | ICD-10-CM | POA: Diagnosis not present

## 2020-06-12 ENCOUNTER — Ambulatory Visit: Payer: Medicare HMO | Admitting: Cardiology

## 2020-06-22 ENCOUNTER — Inpatient Hospital Stay: Payer: Medicare HMO | Admitting: Family

## 2020-06-22 ENCOUNTER — Inpatient Hospital Stay: Payer: Medicare HMO

## 2020-07-03 ENCOUNTER — Ambulatory Visit: Payer: Medicare HMO | Admitting: Family

## 2020-07-03 ENCOUNTER — Other Ambulatory Visit: Payer: Medicare HMO

## 2020-07-04 ENCOUNTER — Other Ambulatory Visit: Payer: Self-pay

## 2020-07-04 ENCOUNTER — Inpatient Hospital Stay: Payer: Medicare HMO | Attending: Hematology & Oncology

## 2020-07-04 ENCOUNTER — Other Ambulatory Visit: Payer: Self-pay | Admitting: Family

## 2020-07-04 ENCOUNTER — Encounter: Payer: Self-pay | Admitting: Family

## 2020-07-04 ENCOUNTER — Inpatient Hospital Stay (HOSPITAL_BASED_OUTPATIENT_CLINIC_OR_DEPARTMENT_OTHER): Payer: Medicare HMO | Admitting: Family

## 2020-07-04 VITALS — BP 155/56 | HR 68 | Temp 98.5°F | Resp 18 | Ht 64.17 in | Wt 151.8 lb

## 2020-07-04 DIAGNOSIS — Z79899 Other long term (current) drug therapy: Secondary | ICD-10-CM | POA: Diagnosis not present

## 2020-07-04 DIAGNOSIS — Z853 Personal history of malignant neoplasm of breast: Secondary | ICD-10-CM | POA: Diagnosis not present

## 2020-07-04 DIAGNOSIS — C9110 Chronic lymphocytic leukemia of B-cell type not having achieved remission: Secondary | ICD-10-CM

## 2020-07-04 LAB — CMP (CANCER CENTER ONLY)
ALT: 13 U/L (ref 0–44)
AST: 16 U/L (ref 15–41)
Albumin: 4.4 g/dL (ref 3.5–5.0)
Alkaline Phosphatase: 47 U/L (ref 38–126)
Anion gap: 8 (ref 5–15)
BUN: 13 mg/dL (ref 8–23)
CO2: 26 mmol/L (ref 22–32)
Calcium: 9.4 mg/dL (ref 8.9–10.3)
Chloride: 96 mmol/L — ABNORMAL LOW (ref 98–111)
Creatinine: 0.74 mg/dL (ref 0.44–1.00)
GFR, Est AFR Am: >60 mL/min (ref >60)
GFR, Estimated: >60 mL/min (ref >60)
Glucose, Bld: 127 mg/dL — ABNORMAL HIGH (ref 70–99)
Potassium: 4.1 mmol/L (ref 3.5–5.1)
Sodium: 130 mmol/L — ABNORMAL LOW (ref 135–145)
Total Bilirubin: 0.6 mg/dL (ref 0.3–1.2)
Total Protein: 5.7 g/dL — ABNORMAL LOW (ref 6.5–8.1)

## 2020-07-04 LAB — SAVE SMEAR(SSMR), FOR PROVIDER SLIDE REVIEW

## 2020-07-04 LAB — CBC WITH DIFFERENTIAL (CANCER CENTER ONLY)
Abs Immature Granulocytes: 0.03 10*3/uL (ref 0.00–0.07)
Basophils Absolute: 0.1 10*3/uL (ref 0.0–0.1)
Basophils Relative: 0 %
Eosinophils Absolute: 0.1 10*3/uL (ref 0.0–0.5)
Eosinophils Relative: 0 %
HCT: 39.1 % (ref 36.0–46.0)
Hemoglobin: 13.2 g/dL (ref 12.0–15.0)
Immature Granulocytes: 0 %
Lymphocytes Relative: 88 %
Lymphs Abs: 22.1 10*3/uL — ABNORMAL HIGH (ref 0.7–4.0)
MCH: 32.7 pg (ref 26.0–34.0)
MCHC: 33.8 g/dL (ref 30.0–36.0)
MCV: 96.8 fL (ref 80.0–100.0)
Monocytes Absolute: 0.4 10*3/uL (ref 0.1–1.0)
Monocytes Relative: 1 %
Neutro Abs: 2.9 10*3/uL (ref 1.7–7.7)
Neutrophils Relative %: 11 %
Platelet Count: 105 10*3/uL — ABNORMAL LOW (ref 150–400)
RBC: 4.04 MIL/uL (ref 3.87–5.11)
RDW: 13.9 % (ref 11.5–15.5)
WBC Count: 25.5 10*3/uL — ABNORMAL HIGH (ref 4.0–10.5)
nRBC: 0 % (ref 0.0–0.2)

## 2020-07-04 NOTE — Progress Notes (Signed)
Hematology and Oncology Follow Up Visit  Heather Knapp [id number] 04-Nov-1934 86 y.o. 07/04/2020   Principle Diagnosis:  Stage a CLL Stage I (T1N0M0) infiltrating ductal carcinoma of the left breast  Current Therapy:        Observation   Interim History:  Heather Knapp is here today with her [name] for follow-up. She is doing quite well and has no complaints at this time.  She still has some memory issues but continues to live independently.  WBC count is stable at 25.5.  No adenopathy noted.  No fever, chills, n/v, cough, rash, SOB, chest pain, palpitations, abdominal pain or change in bowel or bladder habits.  No episodes of bleeding. No bruising or petechiae.  No swelling, tenderness, numbness or tingling in her extremities.  No new falls since her hospitalization in March. No syncope.  She has maintained a good appetite and is staying well hydrated. Her weight is stable.   ECOG Performance Status: 1 - Symptomatic but completely ambulatory  Medications:  Allergies as of 07/04/2020   No Known Allergies     Medication List       Accurate as of July 04, 2020  3:32 PM. If you have any questions, ask your nurse or doctor.        amLODipine 5 MG tablet Commonly known as: NORVASC Take 1 tablet (5 mg total) by mouth daily.   donepezil 10 MG tablet Commonly known as: ARICEPT Take 1 tablet (10 mg total) by mouth at bedtime.   FISH OIL PO Take 1,200 mg by mouth daily.   losartan 25 MG tablet Commonly known as: COZAAR Take 1 tablet (25 mg total) by mouth daily.   memantine 10 MG tablet Commonly known as: NAMENDA TAKE 1 TABLET BY MOUTH TWICE A DAY   multivitamin with minerals tablet Take 1 tablet by mouth daily.   polyethylene glycol 17 g packet Commonly known as: MIRALAX / GLYCOLAX Take 17 g by mouth daily.   Toprol XL 100 MG 24 hr tablet Generic drug: metoprolol succinate Take 100 mg by mouth Daily.       Allergies: No Known Allergies  Past  Medical History, Surgical history, Social history, and Family History were reviewed and updated.  Review of Systems: All other 10 point review of systems is negative.   Physical Exam:  vitals were not taken for this visit.   Wt Readings from Last 3 Encounters:  03/08/20 159 lb 6.3 oz (72.3 kg)  06/24/19 154 lb (69.9 kg)  05/05/19 150 lb (68 kg)    Ocular: Sclerae unicteric, pupils equal, round and reactive to light Ear-nose-throat: Oropharynx clear, dentition fair Lymphatic: No cervical or supraclavicular adenopathy Lungs no rales or rhonchi, good excursion bilaterally Heart regular rate and rhythm, no murmur appreciated Abd soft, nontender, positive bowel sounds, no liver or spleen tip palpated on exam, no fluid wave  MSK no focal spinal tenderness, no joint edema Neuro: non-focal, well-oriented, appropriate affect Breasts: Deferred   Lab Results  Component Value Date   WBC 25.5 (H) 07/04/2020   HGB 13.2 07/04/2020   HCT 39.1 07/04/2020   MCV 96.8 07/04/2020   PLT 105 (L) 07/04/2020   No results found for: FERRITIN, IRON, TIBC, UIBC, IRONPCTSAT Lab Results  Component Value Date   RETICCTPCT 2.0 06/25/2018   RBC 4.04 07/04/2020   RETICCTABS 65.0 10/23/2011   RETICCTABS 65.0 10/23/2011   RETICCTABS 65.0 10/23/2011   RETICCTABS 65.0 10/23/2011   No results found for: KPAFRELGTCHN, LAMBDASER, KAPLAMBRATIO  Lab Results  Component Value Date   HFWYOVZC 588 (L) 08/25/2014   IGA 21 (L) 08/25/2014   IGMSERUM 6 (L) 08/25/2014   Lab Results  Component Value Date   TOTALPROTELP 6.1 07/28/2011   ALBUMINELP 68.5 (H) 07/28/2011   A1GS 5.6 (H) 07/28/2011   A2GS 9.6 07/28/2011   BETS 5.8 07/28/2011   BETA2SER 3.5 07/28/2011   GAMS 7.0 (L) 07/28/2011   MSPIKE NOT DET 07/28/2011   SPEI * 07/28/2011     Chemistry      Component Value Date/Time   NA 133 (L) 03/12/2020 0454   NA 138 02/29/2016 1112   K 4.3 03/12/2020 0454   K 4.4 02/29/2016 1112   CL 100 03/12/2020 0454     CL 98 10/28/2013 0949   CO2 23 03/12/2020 0454   CO2 25 02/29/2016 1112   BUN 15 03/12/2020 0454   BUN 16.6 02/29/2016 1112   CREATININE 0.48 03/12/2020 0454   CREATININE 0.69 06/24/2019 1148   CREATININE 0.7 02/29/2016 1112      Component Value Date/Time   CALCIUM 8.9 03/12/2020 0454   CALCIUM 9.2 02/29/2016 1112   ALKPHOS 36 (L) 03/06/2020 2139   ALKPHOS 61 02/29/2016 1112   AST 26 03/06/2020 2139   AST 15 06/24/2019 1148   AST 17 02/29/2016 1112   ALT 22 03/06/2020 2139   ALT 11 06/24/2019 1148   ALT 12 02/29/2016 1112   BILITOT 1.0 03/06/2020 2139   BILITOT 0.7 06/24/2019 1148   BILITOT 0.72 02/29/2016 1112       Impression and Plan: Heather Knapp is a very pleasant 86 yo caucasian female with CLL. She looks great and continues to do well.  Her counts have remained stable. No intervention needed at this time.  We will see her again in a year.  They will contact our office with any questions or concerns. We can certainly see her sooner if needed.   Laverna Peace, NP 7/28/20213:32 PM

## 2020-07-05 ENCOUNTER — Telehealth: Payer: Self-pay | Admitting: Cardiology

## 2020-07-05 ENCOUNTER — Telehealth: Payer: Self-pay | Admitting: Hematology & Oncology

## 2020-07-05 LAB — LACTATE DEHYDROGENASE: LDH: 197 U/L — ABNORMAL HIGH (ref 98–192)

## 2020-07-05 NOTE — Telephone Encounter (Signed)
LVM for patient to return call for follow up to be scheduled with Ellyn Hack from recall list

## 2020-07-05 NOTE — Telephone Encounter (Signed)
Appointments scheduled calendar printed & mailed per 7/28 los 

## 2020-08-03 DIAGNOSIS — R197 Diarrhea, unspecified: Secondary | ICD-10-CM | POA: Diagnosis not present

## 2020-08-03 DIAGNOSIS — K589 Irritable bowel syndrome without diarrhea: Secondary | ICD-10-CM | POA: Diagnosis not present

## 2020-08-06 DIAGNOSIS — G453 Amaurosis fugax: Secondary | ICD-10-CM | POA: Diagnosis not present

## 2020-08-06 DIAGNOSIS — Z961 Presence of intraocular lens: Secondary | ICD-10-CM | POA: Diagnosis not present

## 2020-08-06 DIAGNOSIS — I10 Essential (primary) hypertension: Secondary | ICD-10-CM | POA: Diagnosis not present

## 2020-08-10 ENCOUNTER — Other Ambulatory Visit: Payer: Self-pay | Admitting: Physician Assistant

## 2020-08-10 DIAGNOSIS — H547 Unspecified visual loss: Secondary | ICD-10-CM | POA: Diagnosis not present

## 2020-08-10 DIAGNOSIS — Z23 Encounter for immunization: Secondary | ICD-10-CM | POA: Diagnosis not present

## 2020-08-10 DIAGNOSIS — R9431 Abnormal electrocardiogram [ECG] [EKG]: Secondary | ICD-10-CM | POA: Diagnosis not present

## 2020-08-29 ENCOUNTER — Ambulatory Visit: Payer: Medicare HMO | Admitting: Physician Assistant
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
## 2020-08-30 ENCOUNTER — Encounter: Payer: Self-pay | Admitting: Physician Assistant

## 2020-08-30 ENCOUNTER — Other Ambulatory Visit: Payer: Self-pay

## 2020-08-30 ENCOUNTER — Ambulatory Visit (INDEPENDENT_AMBULATORY_CARE_PROVIDER_SITE_OTHER): Payer: Medicare HMO | Admitting: Physician Assistant

## 2020-08-30 VITALS — BP 126/72 | HR 69 | Ht 64.0 in | Wt 149.0 lb

## 2020-08-30 DIAGNOSIS — E782 Mixed hyperlipidemia: Secondary | ICD-10-CM | POA: Diagnosis not present

## 2020-08-30 DIAGNOSIS — G459 Transient cerebral ischemic attack, unspecified: Secondary | ICD-10-CM | POA: Diagnosis not present

## 2020-08-30 DIAGNOSIS — R413 Other amnesia: Secondary | ICD-10-CM | POA: Diagnosis not present

## 2020-08-30 DIAGNOSIS — I5032 Chronic diastolic (congestive) heart failure: Secondary | ICD-10-CM

## 2020-08-30 DIAGNOSIS — I340 Nonrheumatic mitral (valve) insufficiency: Secondary | ICD-10-CM

## 2020-08-30 DIAGNOSIS — I10 Essential (primary) hypertension: Secondary | ICD-10-CM | POA: Diagnosis not present

## 2020-08-30 NOTE — Patient Instructions (Signed)
Medication Instructions:  Your physician recommends that you continue on your current medications as directed. Please refer to the Current Medication list given to you today.  *If you need a refill on your cardiac medications before your next appointment, please call your pharmacy*    Testing/Procedures: Your physician has requested that you have an echocardiogram. Echocardiography is a painless test that uses sound waves to create images of your heart. It provides your doctor with information about the size and shape of your heart and how well your heart's chambers and valves are working. This procedure takes approximately one hour. There are no restrictions for this procedure.  Your physician has recommended that you wear a 30 day event monitor. Event monitors are medical devices that record the heart's electrical activity. Doctors most often [place] these monitors to diagnose arrhythmias. Arrhythmias are problems with the speed or rhythm of the heartbeat. The monitor is a small, portable device. You can wear one while you do your normal daily activities. This is usually used to diagnose what is causing palpitations/syncope (passing out).   Follow-Up: At [facility], you and your health needs are our priority.  As part of our continuing mission to provide you with exceptional heart care, we have created designated Provider Care Teams.  These Care Teams include your primary Cardiologist (physician) and Advanced Practice Providers (APPs -  Physician Assistants and Nurse Practitioners) who all work together to provide you with the care you need, when you need it.  We recommend signing up for the patient portal called "MyChart".  Sign up information is provided on this After Visit Summary.  MyChart is used to connect with patients for Virtual Visits (Telemedicine).  Patients are able to view lab/test results, encounter notes, upcoming appointments, etc.  Non-urgent messages can be sent to your provider  as well.   To learn more about what you can do with MyChart, go to [email or web address].    Your next appointment:   Tuesday, 11/13/20 at 4:00 PM IN PERSON with Dr. Ellyn Hack.

## 2020-09-07 HISTORY — PX: TRANSTHORACIC ECHOCARDIOGRAM: SHX275

## 2020-09-11 ENCOUNTER — Other Ambulatory Visit: Payer: Self-pay | Admitting: Neurology

## 2020-09-16 ENCOUNTER — Other Ambulatory Visit: Payer: Self-pay | Admitting: Cardiology

## 2020-09-24 IMAGING — CR DG CHEST 2V
2 series · 2 of 2 positions shown · non-contrast
Comparison: 07/14/2015

CLINICAL DATA: Recent fall with right-sided rib pain, initial
encounter

EXAM:
CHEST - 2 VIEW

[w chest pa]
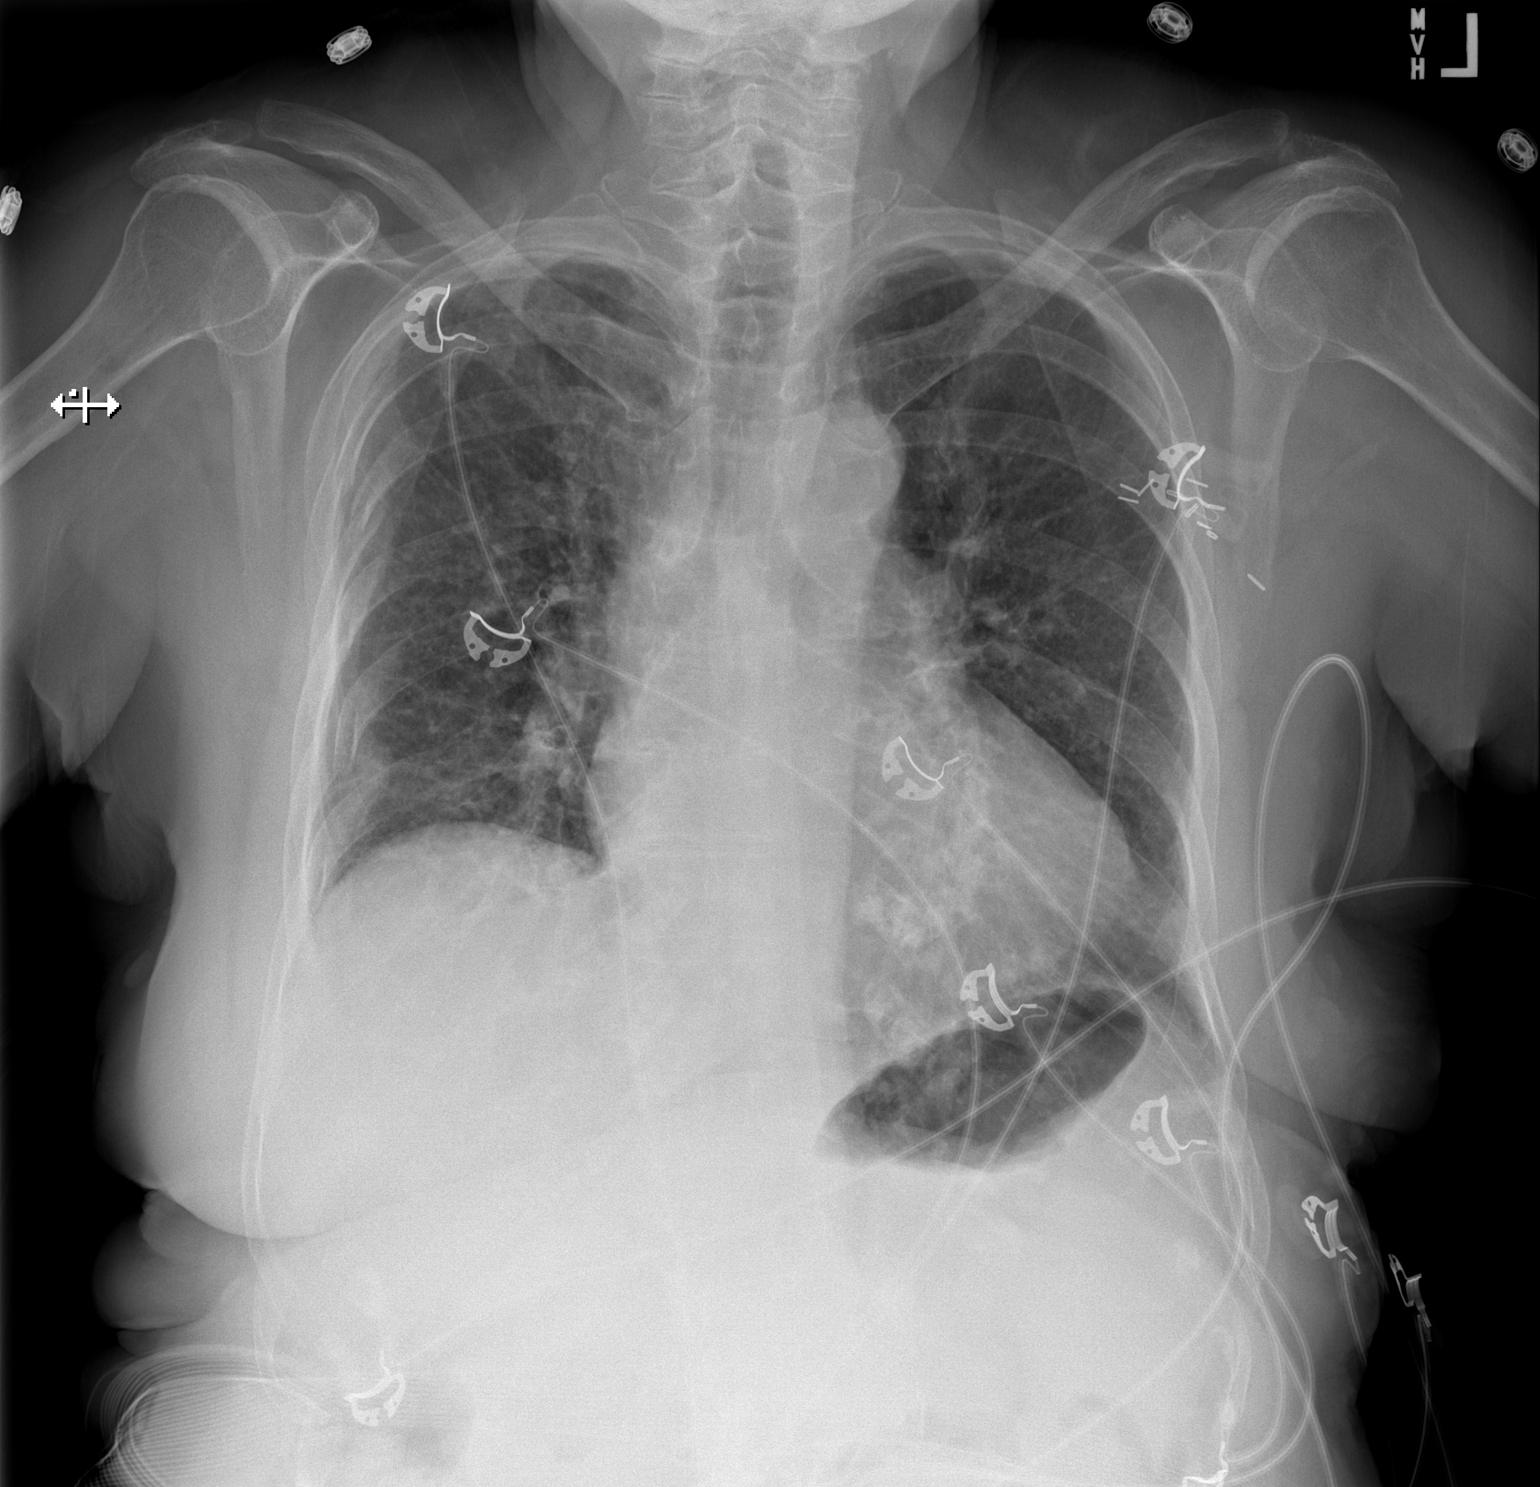

[w chest lat]
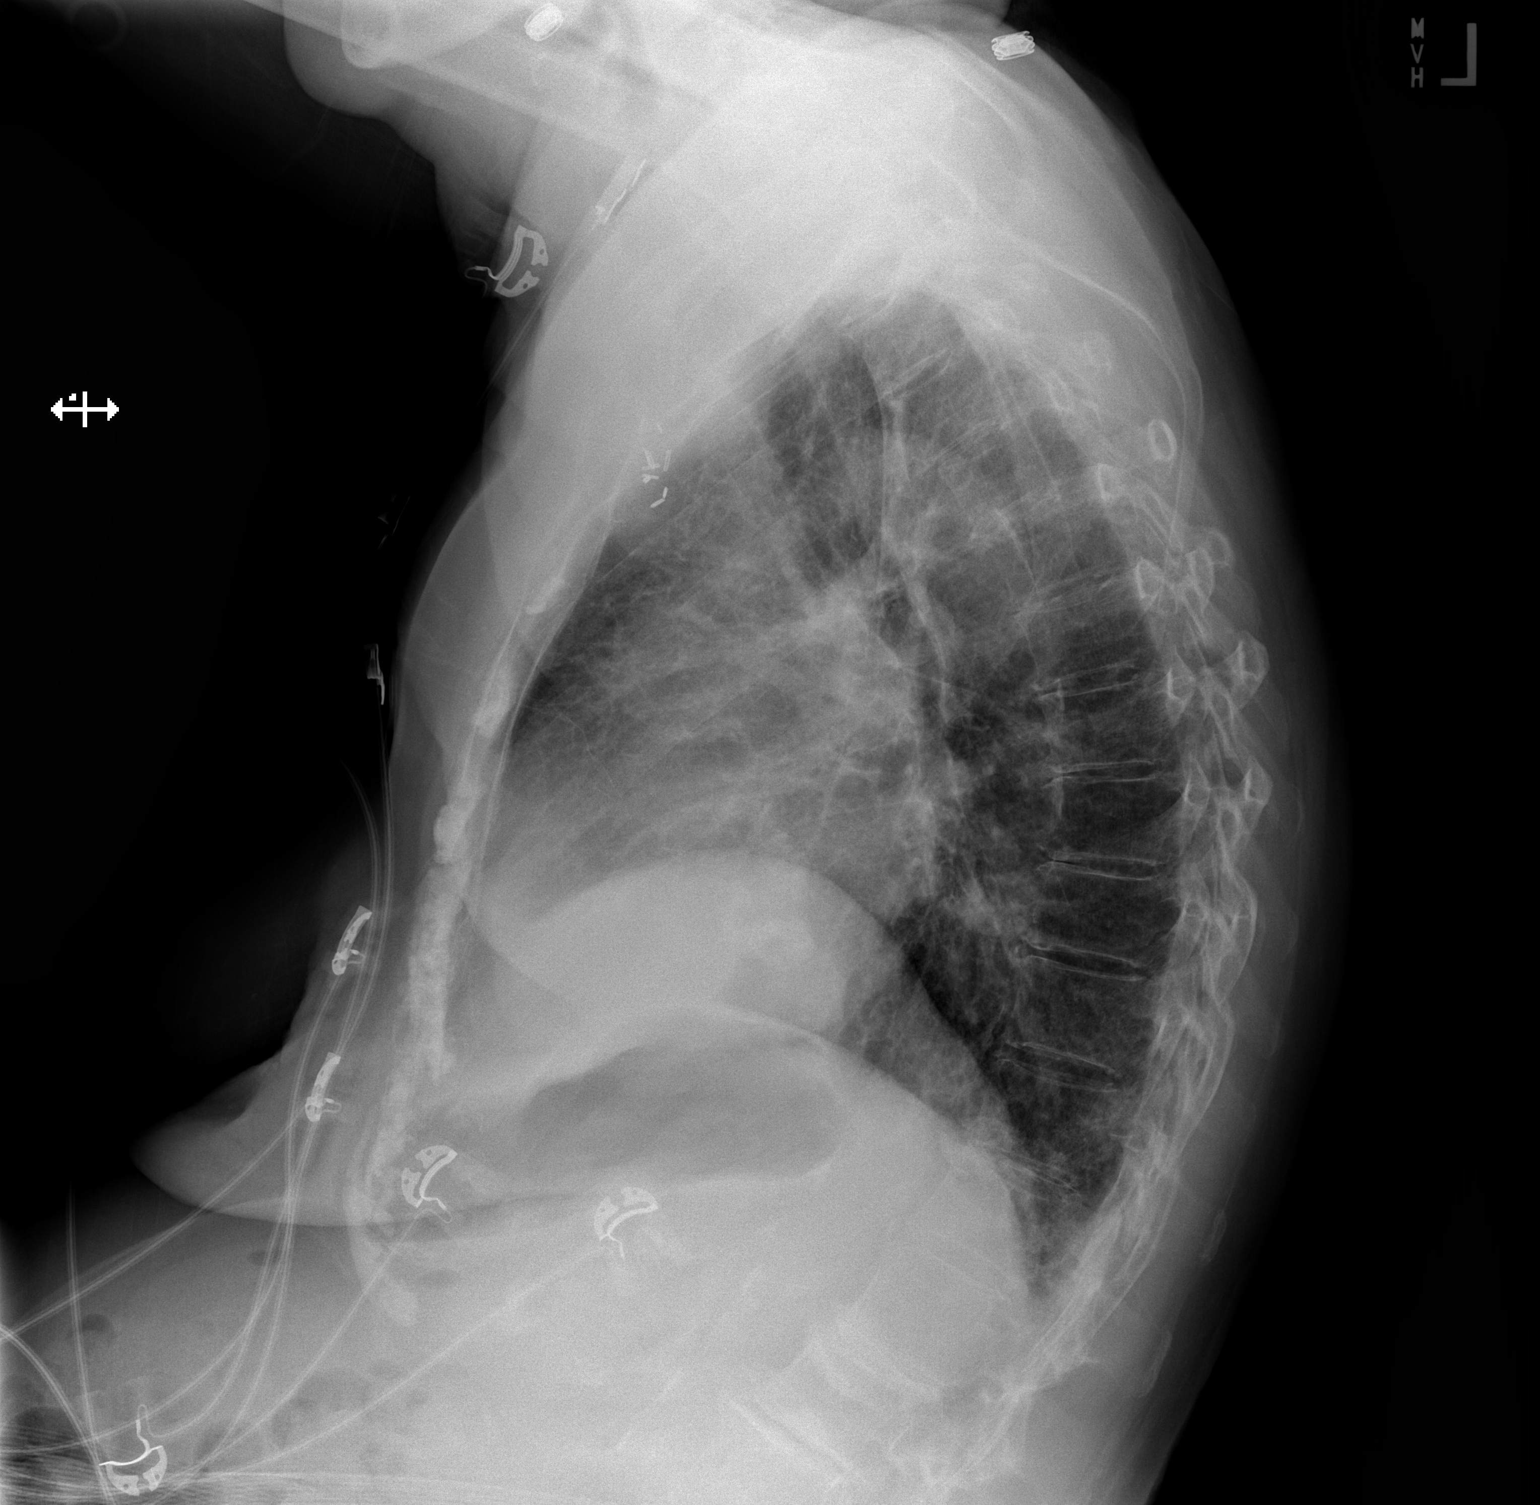

[2 of 2 positions shown; findings below may reference images not displayed]

FINDINGS: Cardiac shadow is mildly enlarged but stable. Aortic calcifications
are seen. The lungs are well aerated bilaterally without focal
infiltrate, effusion or pneumothorax. Multiple rib fractures are
seen involving the right fourth through ninth ribs posterolaterally.
No compression deformity is noted.
IMPRESSION: Multiple right-sided rib fractures without complicating factors.

## 2020-09-28 ENCOUNTER — Ambulatory Visit: Payer: Medicare HMO | Admitting: Cardiology

## 2020-10-05 ENCOUNTER — Ambulatory Visit (HOSPITAL_COMMUNITY): Payer: Medicare HMO | Attending: Cardiology

## 2020-10-05 ENCOUNTER — Other Ambulatory Visit: Payer: Self-pay

## 2020-10-05 DIAGNOSIS — I340 Nonrheumatic mitral (valve) insufficiency: Secondary | ICD-10-CM | POA: Insufficient documentation

## 2020-10-05 LAB — ECHOCARDIOGRAM COMPLETE
Area-P 1/2: 3.93 cm2
S' Lateral: 3.4 cm

## 2020-10-14 ENCOUNTER — Other Ambulatory Visit: Payer: Self-pay | Admitting: Cardiology

## 2020-11-13 ENCOUNTER — Other Ambulatory Visit: Payer: Self-pay

## 2020-11-13 ENCOUNTER — Encounter: Payer: Self-pay | Admitting: Cardiology

## 2020-11-13 ENCOUNTER — Encounter: Payer: Medicare HMO | Admitting: Cardiology

## 2020-11-13 DIAGNOSIS — R413 Other amnesia: Secondary | ICD-10-CM | POA: Diagnosis not present

## 2020-11-13 DIAGNOSIS — I10 Essential (primary) hypertension: Secondary | ICD-10-CM | POA: Diagnosis not present

## 2020-11-13 DIAGNOSIS — C9110 Chronic lymphocytic leukemia of B-cell type not having achieved remission: Secondary | ICD-10-CM | POA: Diagnosis not present

## 2020-11-13 DIAGNOSIS — M79671 Pain in right foot: Secondary | ICD-10-CM | POA: Diagnosis not present

## 2020-11-13 DIAGNOSIS — E7800 Pure hypercholesterolemia, unspecified: Secondary | ICD-10-CM | POA: Diagnosis not present

## 2020-11-14 ENCOUNTER — Telehealth: Payer: Self-pay | Admitting: Cardiology

## 2020-11-14 NOTE — Telephone Encounter (Signed)
Spoke with patient's daughter--Karen to schedule the virtual visit on 12.17.21 at 10:20 ma (per Trixie Dredge, RN).  [name] voiced her understanding.

## 2020-11-22 ENCOUNTER — Other Ambulatory Visit: Payer: Self-pay

## 2020-11-22 ENCOUNTER — Ambulatory Visit: Payer: Medicare HMO | Admitting: Neurology

## 2020-11-22 ENCOUNTER — Encounter: Payer: Self-pay | Admitting: Neurology

## 2020-11-22 VITALS — BP 132/66 | HR 72 | Ht 62.0 in | Wt 152.8 lb

## 2020-11-22 DIAGNOSIS — R269 Unspecified abnormalities of gait and mobility: Secondary | ICD-10-CM

## 2020-11-22 DIAGNOSIS — E871 Hypo-osmolality and hyponatremia: Secondary | ICD-10-CM | POA: Diagnosis not present

## 2020-11-22 DIAGNOSIS — F0390 Unspecified dementia without behavioral disturbance: Secondary | ICD-10-CM | POA: Diagnosis not present

## 2020-11-22 DIAGNOSIS — R69 Illness, unspecified: Secondary | ICD-10-CM | POA: Diagnosis not present

## 2020-11-22 MED ORDER — MEMANTINE HCL 10 MG PO TABS
10.0000 mg | ORAL_TABLET | Freq: Two times a day (BID) | ORAL | 4 refills | Status: DC
Start: 2020-11-22 — End: 2021-11-26
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
## 2020-11-23 ENCOUNTER — Encounter: Payer: Self-pay | Admitting: Cardiology

## 2020-11-23 ENCOUNTER — Encounter: Payer: Self-pay | Admitting: Neurology

## 2020-11-23 ENCOUNTER — Telehealth (INDEPENDENT_AMBULATORY_CARE_PROVIDER_SITE_OTHER): Payer: Medicare HMO | Admitting: Cardiology

## 2020-11-23 VITALS — BP 130/66 | HR 72 | Ht 62.0 in | Wt 152.0 lb

## 2020-11-23 DIAGNOSIS — Z79899 Other long term (current) drug therapy: Secondary | ICD-10-CM

## 2020-11-23 DIAGNOSIS — E782 Mixed hyperlipidemia: Secondary | ICD-10-CM | POA: Diagnosis not present

## 2020-11-23 DIAGNOSIS — G453 Amaurosis fugax: Secondary | ICD-10-CM | POA: Diagnosis not present

## 2020-11-23 DIAGNOSIS — I700 Atherosclerosis of aorta: Secondary | ICD-10-CM | POA: Diagnosis not present

## 2020-11-23 DIAGNOSIS — I5032 Chronic diastolic (congestive) heart failure: Secondary | ICD-10-CM

## 2020-11-23 DIAGNOSIS — I340 Nonrheumatic mitral (valve) insufficiency: Secondary | ICD-10-CM | POA: Diagnosis not present

## 2020-11-23 DIAGNOSIS — I10 Essential (primary) hypertension: Secondary | ICD-10-CM

## 2020-11-23 NOTE — Assessment & Plan Note (Addendum)
Seems euvolemic on questioning evaluation.  Not short of breath.

## 2020-11-23 NOTE — Assessment & Plan Note (Signed)
Stable blood pressure.  Continue current meds.
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
## 2020-11-23 NOTE — Assessment & Plan Note (Addendum)
Very transient episode of visual field cut many months ago now.  No further episodes.  An event monitor would have been helpful, but she could not tolerate wearing 1.  Echocardiogram was very unremarkable. Were she to have milligrams so, would probably discuss the possibility of loop recorder.  Plan: We will order Carotid Dopplers. Continue aspirin.

## 2020-11-23 NOTE — Assessment & Plan Note (Signed)
Recent TIA, started aspirin 81 mg.   Morning statin because of memory loss and concern for possible fatigue

## 2020-11-23 NOTE — Assessment & Plan Note (Addendum)
Continue aspirin.  Especially memory loss and baseline weakness, would probably avoid statin.  Labs are being followed by PCP

## 2020-11-23 NOTE — Patient Instructions (Signed)
Medication Instructions:    Keep taking aspirin  *If you need a refill on your cardiac medications before your next appointment, please call your pharmacy*   Lab Work: Continue to follow-up labs with Dr. Drema Dallas If you have labs (blood work) drawn today and your tests are completely normal, you will receive your results only by: Marland Kitchen MyChart Message (if you have MyChart) OR . A paper copy in the mail If you have any lab test that is abnormal or we need to change your treatment, we will call you to review the results.   Testing/Procedures:   For completion sake let us check Carotid Artery Dopplers to look for blockages in the arteries going up to the head which could explain the "mini stroke "   Follow-Up: At [facility], you and your health needs are our priority.  As part of our continuing mission to provide you with exceptional heart care, we have created designated Provider Care Teams.  These Care Teams include your primary Cardiologist (physician) and Advanced Practice Providers (APPs -  Physician Assistants and Nurse Practitioners) who all work together to provide you with the care you need, when you need it.  We recommend signing up for the patient portal called "MyChart".  Sign up information is provided on this After Visit Summary.  MyChart is used to connect with patients for Virtual Visits (Telemedicine).  Patients are able to view lab/test results, encounter notes, upcoming appointments, etc.  Non-urgent messages can be sent to your provider as well.   To learn more about what you can do with MyChart, go to [email or web address].    Your next appointment:   6 month(s)  The format for your next appointment:   In Person  Provider:   Glenetta Hew, MD   Other Instructions Keep staying active

## 2020-11-23 NOTE — Assessment & Plan Note (Signed)
Since then progressed to daily moderate regurgitation, but no symptoms.  We will continue to monitor for progression.  With no symptoms, would not refer for any further evaluation.  We can consider reordering echo at follow-up visit.

## 2020-11-24 ENCOUNTER — Other Ambulatory Visit: Payer: Self-pay | Admitting: Cardiology

## 2020-11-27 ENCOUNTER — Other Ambulatory Visit: Payer: Self-pay | Admitting: Podiatry

## 2020-11-27 ENCOUNTER — Other Ambulatory Visit: Payer: Self-pay

## 2020-11-27 ENCOUNTER — Ambulatory Visit: Payer: Medicare HMO | Admitting: Podiatry

## 2020-11-27 ENCOUNTER — Ambulatory Visit (INDEPENDENT_AMBULATORY_CARE_PROVIDER_SITE_OTHER): Payer: Medicare HMO

## 2020-11-27 DIAGNOSIS — M21612 Bunion of left foot: Secondary | ICD-10-CM

## 2020-11-27 DIAGNOSIS — L84 Corns and callosities: Secondary | ICD-10-CM

## 2020-11-27 DIAGNOSIS — Z853 Personal history of malignant neoplasm of breast: Secondary | ICD-10-CM | POA: Insufficient documentation

## 2020-11-27 DIAGNOSIS — R52 Pain, unspecified: Secondary | ICD-10-CM

## 2020-11-27 DIAGNOSIS — M2041 Other hammer toe(s) (acquired), right foot: Secondary | ICD-10-CM

## 2020-11-27 DIAGNOSIS — K5730 Diverticulosis of large intestine without perforation or abscess without bleeding: Secondary | ICD-10-CM | POA: Insufficient documentation

## 2020-11-27 DIAGNOSIS — I341 Nonrheumatic mitral (valve) prolapse: Secondary | ICD-10-CM | POA: Insufficient documentation

## 2020-11-27 DIAGNOSIS — H547 Unspecified visual loss: Secondary | ICD-10-CM | POA: Insufficient documentation

## 2020-11-27 DIAGNOSIS — M2042 Other hammer toe(s) (acquired), left foot: Secondary | ICD-10-CM

## 2020-11-27 DIAGNOSIS — M2012 Hallux valgus (acquired), left foot: Secondary | ICD-10-CM | POA: Diagnosis not present

## 2020-11-27 DIAGNOSIS — M8580 Other specified disorders of bone density and structure, unspecified site: Secondary | ICD-10-CM | POA: Insufficient documentation

## 2020-11-27 DIAGNOSIS — M2011 Hallux valgus (acquired), right foot: Secondary | ICD-10-CM | POA: Diagnosis not present

## 2020-11-27 DIAGNOSIS — K589 Irritable bowel syndrome without diarrhea: Secondary | ICD-10-CM | POA: Insufficient documentation

## 2020-11-27 DIAGNOSIS — M21611 Bunion of right foot: Secondary | ICD-10-CM | POA: Diagnosis not present

## 2020-11-30 NOTE — Progress Notes (Signed)
  Subjective:  Patient ID: Heather Knapp, female    DOB: August 28, 1934,  MRN: [id number]  Chief Complaint  Patient presents with  . Hammer Toe    BL 2nd hammer toes and pain with lesion formation at medial sides x years; 5/10 soreness - no redness/swelling/injury tx: corn cushions    86 y.o. female presents with the above complaint. History confirmed with patient.  She is here with her daughter today  Objective:  Physical Exam: warm, good capillary refill, no trophic changes or ulcerative lesions, normal DP and PT pulses and normal sensory exam.  Varicose veins are present.  On the adjacent hallux and second toe medial lateral edges there is a kissing lesion heloma molle with hyperkeratosis and pain.  Hallux valgus and hammertoe contractures are present.  Hammertoes are fairly rigid.  Radiographs: X-ray of both feet: Mild to moderate hallux valgus and hallux interphalangeus with hammertoe contractures noted.   Assessment:  No diagnosis found.   Plan:  Patient was evaluated and treated and all questions answered.  All symptomatic hyperkeratoses were safely debrided with a sterile #15 blade to patient's level of comfort without incident. We discussed preventative and palliative care of these lesions including supportive and accommodative shoegear, padding, prefabricated and custom molded accommodative orthoses, use of a pumice stone and lotions/creams daily.  Recommend offloading lesions with silicone toe crest which I dispensed today.   Also discussed the etiology of the lesions being from her hallux valgus and hammertoes.  They are keen to avoid surgery which I think given her age even though she is quite active and in very good health for 86 years old will be reasonable to try to avoid.  Surgical I discussed arthroplasty of the second toe and a minimally invasive bunion correction with Akin osteotomy and/or chevron.  We will try to see if we can get these under control without surgical  intervention first.  Return in about 3 months (around 02/25/2021).

## 2020-12-03 ENCOUNTER — Other Ambulatory Visit: Payer: Self-pay | Admitting: Podiatry

## 2020-12-03 DIAGNOSIS — L84 Corns and callosities: Secondary | ICD-10-CM

## 2020-12-03 DIAGNOSIS — M2041 Other hammer toe(s) (acquired), right foot: Secondary | ICD-10-CM

## 2020-12-03 DIAGNOSIS — M2042 Other hammer toe(s) (acquired), left foot: Secondary | ICD-10-CM

## 2020-12-04 ENCOUNTER — Ambulatory Visit (HOSPITAL_COMMUNITY)
Admission: RE | Admit: 2020-12-04 | Discharge: 2020-12-04 | Disposition: A | Discharge status: Home / Self Care | Payer: Medicare HMO | Source: Ambulatory Visit | Attending: Cardiology | Admitting: Cardiology

## 2020-12-04 ENCOUNTER — Other Ambulatory Visit (HOSPITAL_COMMUNITY): Payer: Self-pay | Admitting: Cardiology

## 2020-12-04 ENCOUNTER — Other Ambulatory Visit: Payer: Self-pay

## 2020-12-04 DIAGNOSIS — I5032 Chronic diastolic (congestive) heart failure: Secondary | ICD-10-CM | POA: Insufficient documentation

## 2020-12-04 DIAGNOSIS — I10 Essential (primary) hypertension: Secondary | ICD-10-CM | POA: Diagnosis not present

## 2020-12-04 DIAGNOSIS — I6522 Occlusion and stenosis of left carotid artery: Secondary | ICD-10-CM

## 2020-12-04 DIAGNOSIS — E782 Mixed hyperlipidemia: Secondary | ICD-10-CM | POA: Insufficient documentation

## 2020-12-04 DIAGNOSIS — I340 Nonrheumatic mitral (valve) insufficiency: Secondary | ICD-10-CM | POA: Insufficient documentation

## 2020-12-04 DIAGNOSIS — G453 Amaurosis fugax: Secondary | ICD-10-CM | POA: Insufficient documentation

## 2020-12-07 NOTE — Progress Notes (Signed)
This encounter was created in error - please disregard.

## 2020-12-19 ENCOUNTER — Other Ambulatory Visit: Payer: Self-pay | Admitting: Cardiology

## 2020-12-25 ENCOUNTER — Telehealth: Payer: Self-pay | Admitting: Neurology

## 2020-12-25 NOTE — Telephone Encounter (Signed)
Hi Dr. Krista Blue,  You saw my mom Heather Knapp a few weeks ago and recommended she get weekly physical therapy to help with declining cognition. Will you please refer someone to [place] so we can get started? She has [employer], and will need someone who is covered by her insurance. Thank you! Darnice Comrie [phone]   Referral # Creation Date Referral Status Status Update  [id number] 11/22/2020 New Request 11/22/2020: Status History   Status Reason Referral Type Referral Reasons Referral Class  none Home Health Care Specialty Services Required Outgoing   I have put in home health during last visit on November 22, 2020, family has not heard anything from them, please contact home health agent

## 2020-12-26 NOTE — Telephone Encounter (Signed)
Pt.'s Daughter [name] called to see about in home nursing care or a Education officer, museum for mom. She states she is concerned about her neurologic issues. Please advise.

## 2020-12-31 NOTE — Telephone Encounter (Signed)
I have called and spoke to patient's daughter and relayed that [facility] did not accept her mother's insurance and I have since referral to [facility] I hope they can accept her insurance and have staffing .

## 2020-12-31 NOTE — Telephone Encounter (Signed)
I have talked to [facility] and they are going to take her mother.

## 2021-01-02 DIAGNOSIS — K5790 Diverticulosis of intestine, part unspecified, without perforation or abscess without bleeding: Secondary | ICD-10-CM | POA: Diagnosis not present

## 2021-01-02 DIAGNOSIS — H532 Diplopia: Secondary | ICD-10-CM | POA: Diagnosis not present

## 2021-01-02 DIAGNOSIS — E785 Hyperlipidemia, unspecified: Secondary | ICD-10-CM | POA: Diagnosis not present

## 2021-01-02 DIAGNOSIS — I509 Heart failure, unspecified: Secondary | ICD-10-CM | POA: Diagnosis not present

## 2021-01-02 DIAGNOSIS — Z853 Personal history of malignant neoplasm of breast: Secondary | ICD-10-CM | POA: Diagnosis not present

## 2021-01-02 DIAGNOSIS — Z4789 Encounter for other orthopedic aftercare: Secondary | ICD-10-CM | POA: Diagnosis not present

## 2021-01-02 DIAGNOSIS — M2042 Other hammer toe(s) (acquired), left foot: Secondary | ICD-10-CM | POA: Diagnosis not present

## 2021-01-02 DIAGNOSIS — I110 Hypertensive heart disease with heart failure: Secondary | ICD-10-CM | POA: Diagnosis not present

## 2021-01-02 DIAGNOSIS — D696 Thrombocytopenia, unspecified: Secondary | ICD-10-CM | POA: Diagnosis not present

## 2021-01-02 DIAGNOSIS — M8580 Other specified disorders of bone density and structure, unspecified site: Secondary | ICD-10-CM | POA: Diagnosis not present

## 2021-01-02 DIAGNOSIS — S2241XD Multiple fractures of ribs, right side, subsequent encounter for fracture with routine healing: Secondary | ICD-10-CM | POA: Diagnosis not present

## 2021-01-02 DIAGNOSIS — Z856 Personal history of leukemia: Secondary | ICD-10-CM | POA: Diagnosis not present

## 2021-01-02 DIAGNOSIS — I051 Rheumatic mitral insufficiency: Secondary | ICD-10-CM | POA: Diagnosis not present

## 2021-01-02 DIAGNOSIS — Z7982 Long term (current) use of aspirin: Secondary | ICD-10-CM | POA: Diagnosis not present

## 2021-01-02 DIAGNOSIS — Z9012 Acquired absence of left breast and nipple: Secondary | ICD-10-CM | POA: Diagnosis not present

## 2021-01-02 DIAGNOSIS — R69 Illness, unspecified: Secondary | ICD-10-CM | POA: Diagnosis not present

## 2021-01-03 ENCOUNTER — Other Ambulatory Visit: Payer: Self-pay | Admitting: Cardiology

## 2021-01-22 DIAGNOSIS — K5790 Diverticulosis of intestine, part unspecified, without perforation or abscess without bleeding: Secondary | ICD-10-CM | POA: Diagnosis not present

## 2021-01-22 DIAGNOSIS — S2241XD Multiple fractures of ribs, right side, subsequent encounter for fracture with routine healing: Secondary | ICD-10-CM | POA: Diagnosis not present

## 2021-01-22 DIAGNOSIS — I110 Hypertensive heart disease with heart failure: Secondary | ICD-10-CM | POA: Diagnosis not present

## 2021-01-22 DIAGNOSIS — D696 Thrombocytopenia, unspecified: Secondary | ICD-10-CM | POA: Diagnosis not present

## 2021-01-22 DIAGNOSIS — I051 Rheumatic mitral insufficiency: Secondary | ICD-10-CM | POA: Diagnosis not present

## 2021-01-22 DIAGNOSIS — H532 Diplopia: Secondary | ICD-10-CM | POA: Diagnosis not present

## 2021-01-22 DIAGNOSIS — M8580 Other specified disorders of bone density and structure, unspecified site: Secondary | ICD-10-CM | POA: Diagnosis not present

## 2021-01-22 DIAGNOSIS — E785 Hyperlipidemia, unspecified: Secondary | ICD-10-CM | POA: Diagnosis not present

## 2021-01-22 DIAGNOSIS — R69 Illness, unspecified: Secondary | ICD-10-CM | POA: Diagnosis not present

## 2021-01-22 DIAGNOSIS — I509 Heart failure, unspecified: Secondary | ICD-10-CM | POA: Diagnosis not present

## 2021-01-25 DIAGNOSIS — S2241XD Multiple fractures of ribs, right side, subsequent encounter for fracture with routine healing: Secondary | ICD-10-CM | POA: Diagnosis not present

## 2021-01-25 DIAGNOSIS — I110 Hypertensive heart disease with heart failure: Secondary | ICD-10-CM | POA: Diagnosis not present

## 2021-01-25 DIAGNOSIS — K5790 Diverticulosis of intestine, part unspecified, without perforation or abscess without bleeding: Secondary | ICD-10-CM | POA: Diagnosis not present

## 2021-01-25 DIAGNOSIS — M8580 Other specified disorders of bone density and structure, unspecified site: Secondary | ICD-10-CM | POA: Diagnosis not present

## 2021-01-25 DIAGNOSIS — E785 Hyperlipidemia, unspecified: Secondary | ICD-10-CM | POA: Diagnosis not present

## 2021-01-25 DIAGNOSIS — H532 Diplopia: Secondary | ICD-10-CM | POA: Diagnosis not present

## 2021-01-25 DIAGNOSIS — I051 Rheumatic mitral insufficiency: Secondary | ICD-10-CM | POA: Diagnosis not present

## 2021-01-25 DIAGNOSIS — I509 Heart failure, unspecified: Secondary | ICD-10-CM | POA: Diagnosis not present

## 2021-01-25 DIAGNOSIS — D696 Thrombocytopenia, unspecified: Secondary | ICD-10-CM | POA: Diagnosis not present

## 2021-01-25 DIAGNOSIS — R69 Illness, unspecified: Secondary | ICD-10-CM | POA: Diagnosis not present

## 2021-01-30 DIAGNOSIS — D696 Thrombocytopenia, unspecified: Secondary | ICD-10-CM | POA: Diagnosis not present

## 2021-01-30 DIAGNOSIS — I509 Heart failure, unspecified: Secondary | ICD-10-CM | POA: Diagnosis not present

## 2021-01-30 DIAGNOSIS — K5790 Diverticulosis of intestine, part unspecified, without perforation or abscess without bleeding: Secondary | ICD-10-CM | POA: Diagnosis not present

## 2021-01-30 DIAGNOSIS — I110 Hypertensive heart disease with heart failure: Secondary | ICD-10-CM | POA: Diagnosis not present

## 2021-01-30 DIAGNOSIS — S2241XD Multiple fractures of ribs, right side, subsequent encounter for fracture with routine healing: Secondary | ICD-10-CM | POA: Diagnosis not present

## 2021-01-30 DIAGNOSIS — I051 Rheumatic mitral insufficiency: Secondary | ICD-10-CM | POA: Diagnosis not present

## 2021-01-30 DIAGNOSIS — R69 Illness, unspecified: Secondary | ICD-10-CM | POA: Diagnosis not present

## 2021-01-30 DIAGNOSIS — H532 Diplopia: Secondary | ICD-10-CM | POA: Diagnosis not present

## 2021-01-30 DIAGNOSIS — M8580 Other specified disorders of bone density and structure, unspecified site: Secondary | ICD-10-CM | POA: Diagnosis not present

## 2021-01-30 DIAGNOSIS — E785 Hyperlipidemia, unspecified: Secondary | ICD-10-CM | POA: Diagnosis not present

## 2021-01-31 DIAGNOSIS — E785 Hyperlipidemia, unspecified: Secondary | ICD-10-CM | POA: Diagnosis not present

## 2021-01-31 DIAGNOSIS — D696 Thrombocytopenia, unspecified: Secondary | ICD-10-CM | POA: Diagnosis not present

## 2021-01-31 DIAGNOSIS — S2241XD Multiple fractures of ribs, right side, subsequent encounter for fracture with routine healing: Secondary | ICD-10-CM | POA: Diagnosis not present

## 2021-01-31 DIAGNOSIS — K5790 Diverticulosis of intestine, part unspecified, without perforation or abscess without bleeding: Secondary | ICD-10-CM | POA: Diagnosis not present

## 2021-01-31 DIAGNOSIS — R69 Illness, unspecified: Secondary | ICD-10-CM | POA: Diagnosis not present

## 2021-01-31 DIAGNOSIS — M8580 Other specified disorders of bone density and structure, unspecified site: Secondary | ICD-10-CM | POA: Diagnosis not present

## 2021-01-31 DIAGNOSIS — I051 Rheumatic mitral insufficiency: Secondary | ICD-10-CM | POA: Diagnosis not present

## 2021-01-31 DIAGNOSIS — H532 Diplopia: Secondary | ICD-10-CM | POA: Diagnosis not present

## 2021-01-31 DIAGNOSIS — I509 Heart failure, unspecified: Secondary | ICD-10-CM | POA: Diagnosis not present

## 2021-01-31 DIAGNOSIS — I110 Hypertensive heart disease with heart failure: Secondary | ICD-10-CM | POA: Diagnosis not present

## 2021-02-04 DIAGNOSIS — M2042 Other hammer toe(s) (acquired), left foot: Secondary | ICD-10-CM | POA: Diagnosis not present

## 2021-02-04 DIAGNOSIS — I051 Rheumatic mitral insufficiency: Secondary | ICD-10-CM | POA: Diagnosis not present

## 2021-02-04 DIAGNOSIS — Z856 Personal history of leukemia: Secondary | ICD-10-CM | POA: Diagnosis not present

## 2021-02-04 DIAGNOSIS — D696 Thrombocytopenia, unspecified: Secondary | ICD-10-CM | POA: Diagnosis not present

## 2021-02-04 DIAGNOSIS — Z853 Personal history of malignant neoplasm of breast: Secondary | ICD-10-CM | POA: Diagnosis not present

## 2021-02-04 DIAGNOSIS — R69 Illness, unspecified: Secondary | ICD-10-CM | POA: Diagnosis not present

## 2021-02-04 DIAGNOSIS — E785 Hyperlipidemia, unspecified: Secondary | ICD-10-CM | POA: Diagnosis not present

## 2021-02-04 DIAGNOSIS — I509 Heart failure, unspecified: Secondary | ICD-10-CM | POA: Diagnosis not present

## 2021-02-04 DIAGNOSIS — Z7982 Long term (current) use of aspirin: Secondary | ICD-10-CM | POA: Diagnosis not present

## 2021-02-04 DIAGNOSIS — Z9012 Acquired absence of left breast and nipple: Secondary | ICD-10-CM | POA: Diagnosis not present

## 2021-02-04 DIAGNOSIS — H532 Diplopia: Secondary | ICD-10-CM | POA: Diagnosis not present

## 2021-02-04 DIAGNOSIS — K5790 Diverticulosis of intestine, part unspecified, without perforation or abscess without bleeding: Secondary | ICD-10-CM | POA: Diagnosis not present

## 2021-02-04 DIAGNOSIS — I110 Hypertensive heart disease with heart failure: Secondary | ICD-10-CM | POA: Diagnosis not present

## 2021-02-04 DIAGNOSIS — S2241XD Multiple fractures of ribs, right side, subsequent encounter for fracture with routine healing: Secondary | ICD-10-CM | POA: Diagnosis not present

## 2021-02-04 DIAGNOSIS — M8580 Other specified disorders of bone density and structure, unspecified site: Secondary | ICD-10-CM | POA: Diagnosis not present

## 2021-02-11 DIAGNOSIS — Z856 Personal history of leukemia: Secondary | ICD-10-CM | POA: Diagnosis not present

## 2021-02-11 DIAGNOSIS — H532 Diplopia: Secondary | ICD-10-CM | POA: Diagnosis not present

## 2021-02-11 DIAGNOSIS — D696 Thrombocytopenia, unspecified: Secondary | ICD-10-CM | POA: Diagnosis not present

## 2021-02-11 DIAGNOSIS — S2241XD Multiple fractures of ribs, right side, subsequent encounter for fracture with routine healing: Secondary | ICD-10-CM | POA: Diagnosis not present

## 2021-02-11 DIAGNOSIS — K5790 Diverticulosis of intestine, part unspecified, without perforation or abscess without bleeding: Secondary | ICD-10-CM | POA: Diagnosis not present

## 2021-02-11 DIAGNOSIS — Z9012 Acquired absence of left breast and nipple: Secondary | ICD-10-CM | POA: Diagnosis not present

## 2021-02-11 DIAGNOSIS — E785 Hyperlipidemia, unspecified: Secondary | ICD-10-CM | POA: Diagnosis not present

## 2021-02-11 DIAGNOSIS — M2042 Other hammer toe(s) (acquired), left foot: Secondary | ICD-10-CM | POA: Diagnosis not present

## 2021-02-11 DIAGNOSIS — I110 Hypertensive heart disease with heart failure: Secondary | ICD-10-CM | POA: Diagnosis not present

## 2021-02-11 DIAGNOSIS — R69 Illness, unspecified: Secondary | ICD-10-CM | POA: Diagnosis not present

## 2021-02-11 DIAGNOSIS — I509 Heart failure, unspecified: Secondary | ICD-10-CM | POA: Diagnosis not present

## 2021-02-11 DIAGNOSIS — Z7982 Long term (current) use of aspirin: Secondary | ICD-10-CM | POA: Diagnosis not present

## 2021-02-11 DIAGNOSIS — Z853 Personal history of malignant neoplasm of breast: Secondary | ICD-10-CM | POA: Diagnosis not present

## 2021-02-11 DIAGNOSIS — I051 Rheumatic mitral insufficiency: Secondary | ICD-10-CM | POA: Diagnosis not present

## 2021-02-11 DIAGNOSIS — M8580 Other specified disorders of bone density and structure, unspecified site: Secondary | ICD-10-CM | POA: Diagnosis not present

## 2021-02-15 DIAGNOSIS — L84 Corns and callosities: Secondary | ICD-10-CM | POA: Diagnosis not present

## 2021-02-15 DIAGNOSIS — M79675 Pain in left toe(s): Secondary | ICD-10-CM | POA: Diagnosis not present

## 2021-02-18 DIAGNOSIS — E785 Hyperlipidemia, unspecified: Secondary | ICD-10-CM | POA: Diagnosis not present

## 2021-02-18 DIAGNOSIS — I509 Heart failure, unspecified: Secondary | ICD-10-CM | POA: Diagnosis not present

## 2021-02-18 DIAGNOSIS — Z9012 Acquired absence of left breast and nipple: Secondary | ICD-10-CM | POA: Diagnosis not present

## 2021-02-18 DIAGNOSIS — Z7982 Long term (current) use of aspirin: Secondary | ICD-10-CM | POA: Diagnosis not present

## 2021-02-18 DIAGNOSIS — R69 Illness, unspecified: Secondary | ICD-10-CM | POA: Diagnosis not present

## 2021-02-18 DIAGNOSIS — H532 Diplopia: Secondary | ICD-10-CM | POA: Diagnosis not present

## 2021-02-18 DIAGNOSIS — I110 Hypertensive heart disease with heart failure: Secondary | ICD-10-CM | POA: Diagnosis not present

## 2021-02-18 DIAGNOSIS — M2042 Other hammer toe(s) (acquired), left foot: Secondary | ICD-10-CM | POA: Diagnosis not present

## 2021-02-18 DIAGNOSIS — K5790 Diverticulosis of intestine, part unspecified, without perforation or abscess without bleeding: Secondary | ICD-10-CM | POA: Diagnosis not present

## 2021-02-18 DIAGNOSIS — S2241XD Multiple fractures of ribs, right side, subsequent encounter for fracture with routine healing: Secondary | ICD-10-CM | POA: Diagnosis not present

## 2021-02-18 DIAGNOSIS — D696 Thrombocytopenia, unspecified: Secondary | ICD-10-CM | POA: Diagnosis not present

## 2021-02-18 DIAGNOSIS — I051 Rheumatic mitral insufficiency: Secondary | ICD-10-CM | POA: Diagnosis not present

## 2021-02-18 DIAGNOSIS — M8580 Other specified disorders of bone density and structure, unspecified site: Secondary | ICD-10-CM | POA: Diagnosis not present

## 2021-02-18 DIAGNOSIS — Z853 Personal history of malignant neoplasm of breast: Secondary | ICD-10-CM | POA: Diagnosis not present

## 2021-02-18 DIAGNOSIS — Z856 Personal history of leukemia: Secondary | ICD-10-CM | POA: Diagnosis not present

## 2021-02-26 DIAGNOSIS — Z856 Personal history of leukemia: Secondary | ICD-10-CM | POA: Diagnosis not present

## 2021-02-26 DIAGNOSIS — I110 Hypertensive heart disease with heart failure: Secondary | ICD-10-CM | POA: Diagnosis not present

## 2021-02-26 DIAGNOSIS — Z853 Personal history of malignant neoplasm of breast: Secondary | ICD-10-CM | POA: Diagnosis not present

## 2021-02-26 DIAGNOSIS — E785 Hyperlipidemia, unspecified: Secondary | ICD-10-CM | POA: Diagnosis not present

## 2021-02-26 DIAGNOSIS — M2042 Other hammer toe(s) (acquired), left foot: Secondary | ICD-10-CM | POA: Diagnosis not present

## 2021-02-26 DIAGNOSIS — K5790 Diverticulosis of intestine, part unspecified, without perforation or abscess without bleeding: Secondary | ICD-10-CM | POA: Diagnosis not present

## 2021-02-26 DIAGNOSIS — D696 Thrombocytopenia, unspecified: Secondary | ICD-10-CM | POA: Diagnosis not present

## 2021-02-26 DIAGNOSIS — M8580 Other specified disorders of bone density and structure, unspecified site: Secondary | ICD-10-CM | POA: Diagnosis not present

## 2021-02-26 DIAGNOSIS — R69 Illness, unspecified: Secondary | ICD-10-CM | POA: Diagnosis not present

## 2021-02-26 DIAGNOSIS — S2241XD Multiple fractures of ribs, right side, subsequent encounter for fracture with routine healing: Secondary | ICD-10-CM | POA: Diagnosis not present

## 2021-02-26 DIAGNOSIS — H532 Diplopia: Secondary | ICD-10-CM | POA: Diagnosis not present

## 2021-02-26 DIAGNOSIS — I051 Rheumatic mitral insufficiency: Secondary | ICD-10-CM | POA: Diagnosis not present

## 2021-02-26 DIAGNOSIS — Z9012 Acquired absence of left breast and nipple: Secondary | ICD-10-CM | POA: Diagnosis not present

## 2021-02-26 DIAGNOSIS — Z7982 Long term (current) use of aspirin: Secondary | ICD-10-CM | POA: Diagnosis not present

## 2021-02-26 DIAGNOSIS — I509 Heart failure, unspecified: Secondary | ICD-10-CM | POA: Diagnosis not present

## 2021-03-01 ENCOUNTER — Ambulatory Visit: Payer: Medicare HMO | Admitting: Podiatry

## 2021-03-04 ENCOUNTER — Ambulatory Visit: Payer: Medicare HMO | Admitting: Podiatry

## 2021-03-04 ENCOUNTER — Other Ambulatory Visit: Payer: Self-pay

## 2021-03-04 DIAGNOSIS — M2042 Other hammer toe(s) (acquired), left foot: Secondary | ICD-10-CM | POA: Diagnosis not present

## 2021-03-04 DIAGNOSIS — M21611 Bunion of right foot: Secondary | ICD-10-CM

## 2021-03-04 DIAGNOSIS — L84 Corns and callosities: Secondary | ICD-10-CM

## 2021-03-04 DIAGNOSIS — M2011 Hallux valgus (acquired), right foot: Secondary | ICD-10-CM | POA: Diagnosis not present

## 2021-03-05 ENCOUNTER — Encounter: Payer: Self-pay | Admitting: Podiatry

## 2021-03-05 NOTE — Progress Notes (Signed)
  Subjective:  Patient ID: Heather Knapp, female    DOB: 1934/06/23,  MRN: [id number]  Chief Complaint  Patient presents with  . Foot Pain    Left foot 2nd toe pain. PT stated that she has pain when walking    87 y.o. female returns with the above complaint. History confirmed with patient.  She is here with her daughter today.  The lesion is now much better.  They have been using a silicone pad without much relief.  Objective:  Physical Exam: warm, good capillary refill, no trophic changes or ulcerative lesions, normal DP and PT pulses and normal sensory exam.  Varicose veins are present.  On the adjacent hallux and second toe medial lateral edges there is a kissing lesion heloma molle with hyperkeratosis and pain.  Hallux valgus and hammertoe contractures are present.  Hammertoes are fairly rigid.  Radiographs: X-ray of both feet: Mild to moderate hallux valgus and hallux interphalangeus with hammertoe contractures noted.   Assessment:   1. Hammertoe of left foot   2. Callus of foot   3. Hallux valgus with bunions, right      Plan:  Patient was evaluated and treated and all questions answered.  At this point her hammertoe deformity and associated hallux valgus with painful callus has not improved with nonsurgical treatment.  We discussed further treatment including correction of the hammertoe deformity, as well as bunion correction which I typically do with hammertoe correction.  We determined that it would be really best to alleviate the painful lesion with an arthroplasty of the second toe which would take pressure off this.  The hallux valgus will remain in we should only address this if it continues to be asymptomatic issue for her along the second toe after surgery for the toe.  And this is reasonable and will plan for surgery later this spring or summer.  Informed consent was signed and reviewed today.  Surgically discussed the arthroplasty of the PIPJ of the second toe.  We  discussed all risk, benefits and potential complications.  Surgery be scheduled with our surgery scheduler   Surgical plan:  Procedure: -Left second hammertoe arthroplasty  Location: -[facility]  Anesthesia plan: -IV sedation with local anesthesia  Postoperative pain plan: - Tylenol 1000 mg every 6 hours, oxycodone 5 mg 1 tab every 6 hours as needed  DVT prophylaxis: -None required  WB Restrictions / DME needs: -WBAT in surgical shoe which was dispensed today  No follow-ups on file.

## 2021-03-08 DIAGNOSIS — Z7982 Long term (current) use of aspirin: Secondary | ICD-10-CM | POA: Diagnosis not present

## 2021-03-08 DIAGNOSIS — I051 Rheumatic mitral insufficiency: Secondary | ICD-10-CM | POA: Diagnosis not present

## 2021-03-08 DIAGNOSIS — H532 Diplopia: Secondary | ICD-10-CM | POA: Diagnosis not present

## 2021-03-08 DIAGNOSIS — Z9012 Acquired absence of left breast and nipple: Secondary | ICD-10-CM | POA: Diagnosis not present

## 2021-03-08 DIAGNOSIS — M8580 Other specified disorders of bone density and structure, unspecified site: Secondary | ICD-10-CM | POA: Diagnosis not present

## 2021-03-08 DIAGNOSIS — E785 Hyperlipidemia, unspecified: Secondary | ICD-10-CM | POA: Diagnosis not present

## 2021-03-08 DIAGNOSIS — K5790 Diverticulosis of intestine, part unspecified, without perforation or abscess without bleeding: Secondary | ICD-10-CM | POA: Diagnosis not present

## 2021-03-08 DIAGNOSIS — I110 Hypertensive heart disease with heart failure: Secondary | ICD-10-CM | POA: Diagnosis not present

## 2021-03-08 DIAGNOSIS — S2241XD Multiple fractures of ribs, right side, subsequent encounter for fracture with routine healing: Secondary | ICD-10-CM | POA: Diagnosis not present

## 2021-03-08 DIAGNOSIS — D696 Thrombocytopenia, unspecified: Secondary | ICD-10-CM | POA: Diagnosis not present

## 2021-03-08 DIAGNOSIS — Z856 Personal history of leukemia: Secondary | ICD-10-CM | POA: Diagnosis not present

## 2021-03-08 DIAGNOSIS — R69 Illness, unspecified: Secondary | ICD-10-CM | POA: Diagnosis not present

## 2021-03-08 DIAGNOSIS — Z853 Personal history of malignant neoplasm of breast: Secondary | ICD-10-CM | POA: Diagnosis not present

## 2021-03-08 DIAGNOSIS — I509 Heart failure, unspecified: Secondary | ICD-10-CM | POA: Diagnosis not present

## 2021-03-08 DIAGNOSIS — M2042 Other hammer toe(s) (acquired), left foot: Secondary | ICD-10-CM | POA: Diagnosis not present

## 2021-03-11 DIAGNOSIS — D696 Thrombocytopenia, unspecified: Secondary | ICD-10-CM | POA: Diagnosis not present

## 2021-03-11 DIAGNOSIS — Z7982 Long term (current) use of aspirin: Secondary | ICD-10-CM | POA: Diagnosis not present

## 2021-03-11 DIAGNOSIS — Z9012 Acquired absence of left breast and nipple: Secondary | ICD-10-CM | POA: Diagnosis not present

## 2021-03-11 DIAGNOSIS — E785 Hyperlipidemia, unspecified: Secondary | ICD-10-CM | POA: Diagnosis not present

## 2021-03-11 DIAGNOSIS — M2042 Other hammer toe(s) (acquired), left foot: Secondary | ICD-10-CM | POA: Diagnosis not present

## 2021-03-11 DIAGNOSIS — Z853 Personal history of malignant neoplasm of breast: Secondary | ICD-10-CM | POA: Diagnosis not present

## 2021-03-11 DIAGNOSIS — K5790 Diverticulosis of intestine, part unspecified, without perforation or abscess without bleeding: Secondary | ICD-10-CM | POA: Diagnosis not present

## 2021-03-11 DIAGNOSIS — I110 Hypertensive heart disease with heart failure: Secondary | ICD-10-CM | POA: Diagnosis not present

## 2021-03-11 DIAGNOSIS — Z856 Personal history of leukemia: Secondary | ICD-10-CM | POA: Diagnosis not present

## 2021-03-11 DIAGNOSIS — H532 Diplopia: Secondary | ICD-10-CM | POA: Diagnosis not present

## 2021-03-11 DIAGNOSIS — S2241XD Multiple fractures of ribs, right side, subsequent encounter for fracture with routine healing: Secondary | ICD-10-CM | POA: Diagnosis not present

## 2021-03-11 DIAGNOSIS — R69 Illness, unspecified: Secondary | ICD-10-CM | POA: Diagnosis not present

## 2021-03-11 DIAGNOSIS — I509 Heart failure, unspecified: Secondary | ICD-10-CM | POA: Diagnosis not present

## 2021-03-11 DIAGNOSIS — M8580 Other specified disorders of bone density and structure, unspecified site: Secondary | ICD-10-CM | POA: Diagnosis not present

## 2021-03-11 DIAGNOSIS — I051 Rheumatic mitral insufficiency: Secondary | ICD-10-CM | POA: Diagnosis not present

## 2021-03-12 ENCOUNTER — Telehealth: Payer: Self-pay | Admitting: Urology

## 2021-03-12 NOTE — Telephone Encounter (Signed)
DOS - 04/05/21  HAMMERTOE REPAIR 2ND LEFT --- [id number]   [employer] EFFECTIVE DATE - 12/08/20  PLAN DEDUCTIBLE -$0.00 W/ $0.00 REMAINING OUT OF POCKET - $4,500.00 W/ $4,471.[place] $200.00  COINSURANCE -  0%   PER [employer] WEBSITE ([employer] INSURANCE) FOR CPT CODE 27737 NO PRIOR AUTH IS REQUIRED.  Transaction ID: [id number]

## 2021-03-13 DIAGNOSIS — D696 Thrombocytopenia, unspecified: Secondary | ICD-10-CM | POA: Diagnosis not present

## 2021-03-13 DIAGNOSIS — I509 Heart failure, unspecified: Secondary | ICD-10-CM | POA: Diagnosis not present

## 2021-03-13 DIAGNOSIS — H532 Diplopia: Secondary | ICD-10-CM | POA: Diagnosis not present

## 2021-03-13 DIAGNOSIS — I110 Hypertensive heart disease with heart failure: Secondary | ICD-10-CM | POA: Diagnosis not present

## 2021-03-13 DIAGNOSIS — R69 Illness, unspecified: Secondary | ICD-10-CM | POA: Diagnosis not present

## 2021-03-13 DIAGNOSIS — E785 Hyperlipidemia, unspecified: Secondary | ICD-10-CM | POA: Diagnosis not present

## 2021-03-13 DIAGNOSIS — I051 Rheumatic mitral insufficiency: Secondary | ICD-10-CM | POA: Diagnosis not present

## 2021-03-13 DIAGNOSIS — M2042 Other hammer toe(s) (acquired), left foot: Secondary | ICD-10-CM | POA: Diagnosis not present

## 2021-03-13 DIAGNOSIS — Z7982 Long term (current) use of aspirin: Secondary | ICD-10-CM | POA: Diagnosis not present

## 2021-03-13 DIAGNOSIS — Z856 Personal history of leukemia: Secondary | ICD-10-CM | POA: Diagnosis not present

## 2021-03-13 DIAGNOSIS — K5790 Diverticulosis of intestine, part unspecified, without perforation or abscess without bleeding: Secondary | ICD-10-CM | POA: Diagnosis not present

## 2021-03-13 DIAGNOSIS — M8580 Other specified disorders of bone density and structure, unspecified site: Secondary | ICD-10-CM | POA: Diagnosis not present

## 2021-03-13 DIAGNOSIS — S2241XD Multiple fractures of ribs, right side, subsequent encounter for fracture with routine healing: Secondary | ICD-10-CM | POA: Diagnosis not present

## 2021-03-13 DIAGNOSIS — Z9012 Acquired absence of left breast and nipple: Secondary | ICD-10-CM | POA: Diagnosis not present

## 2021-03-13 DIAGNOSIS — Z853 Personal history of malignant neoplasm of breast: Secondary | ICD-10-CM | POA: Diagnosis not present

## 2021-03-18 DIAGNOSIS — R69 Illness, unspecified: Secondary | ICD-10-CM | POA: Diagnosis not present

## 2021-03-18 DIAGNOSIS — Z9012 Acquired absence of left breast and nipple: Secondary | ICD-10-CM | POA: Diagnosis not present

## 2021-03-18 DIAGNOSIS — E785 Hyperlipidemia, unspecified: Secondary | ICD-10-CM | POA: Diagnosis not present

## 2021-03-18 DIAGNOSIS — M2042 Other hammer toe(s) (acquired), left foot: Secondary | ICD-10-CM | POA: Diagnosis not present

## 2021-03-18 DIAGNOSIS — S2241XD Multiple fractures of ribs, right side, subsequent encounter for fracture with routine healing: Secondary | ICD-10-CM | POA: Diagnosis not present

## 2021-03-18 DIAGNOSIS — Z853 Personal history of malignant neoplasm of breast: Secondary | ICD-10-CM | POA: Diagnosis not present

## 2021-03-18 DIAGNOSIS — Z7982 Long term (current) use of aspirin: Secondary | ICD-10-CM | POA: Diagnosis not present

## 2021-03-18 DIAGNOSIS — D696 Thrombocytopenia, unspecified: Secondary | ICD-10-CM | POA: Diagnosis not present

## 2021-03-18 DIAGNOSIS — K5790 Diverticulosis of intestine, part unspecified, without perforation or abscess without bleeding: Secondary | ICD-10-CM | POA: Diagnosis not present

## 2021-03-18 DIAGNOSIS — I509 Heart failure, unspecified: Secondary | ICD-10-CM | POA: Diagnosis not present

## 2021-03-18 DIAGNOSIS — M8580 Other specified disorders of bone density and structure, unspecified site: Secondary | ICD-10-CM | POA: Diagnosis not present

## 2021-03-18 DIAGNOSIS — H532 Diplopia: Secondary | ICD-10-CM | POA: Diagnosis not present

## 2021-03-18 DIAGNOSIS — I051 Rheumatic mitral insufficiency: Secondary | ICD-10-CM | POA: Diagnosis not present

## 2021-03-18 DIAGNOSIS — Z856 Personal history of leukemia: Secondary | ICD-10-CM | POA: Diagnosis not present

## 2021-03-18 DIAGNOSIS — I110 Hypertensive heart disease with heart failure: Secondary | ICD-10-CM | POA: Diagnosis not present

## 2021-03-20 DIAGNOSIS — I509 Heart failure, unspecified: Secondary | ICD-10-CM | POA: Diagnosis not present

## 2021-03-20 DIAGNOSIS — Z853 Personal history of malignant neoplasm of breast: Secondary | ICD-10-CM | POA: Diagnosis not present

## 2021-03-20 DIAGNOSIS — Z856 Personal history of leukemia: Secondary | ICD-10-CM | POA: Diagnosis not present

## 2021-03-20 DIAGNOSIS — K5790 Diverticulosis of intestine, part unspecified, without perforation or abscess without bleeding: Secondary | ICD-10-CM | POA: Diagnosis not present

## 2021-03-20 DIAGNOSIS — H532 Diplopia: Secondary | ICD-10-CM | POA: Diagnosis not present

## 2021-03-20 DIAGNOSIS — M2042 Other hammer toe(s) (acquired), left foot: Secondary | ICD-10-CM | POA: Diagnosis not present

## 2021-03-20 DIAGNOSIS — R69 Illness, unspecified: Secondary | ICD-10-CM | POA: Diagnosis not present

## 2021-03-20 DIAGNOSIS — Z9012 Acquired absence of left breast and nipple: Secondary | ICD-10-CM | POA: Diagnosis not present

## 2021-03-20 DIAGNOSIS — D696 Thrombocytopenia, unspecified: Secondary | ICD-10-CM | POA: Diagnosis not present

## 2021-03-20 DIAGNOSIS — M8580 Other specified disorders of bone density and structure, unspecified site: Secondary | ICD-10-CM | POA: Diagnosis not present

## 2021-03-20 DIAGNOSIS — I110 Hypertensive heart disease with heart failure: Secondary | ICD-10-CM | POA: Diagnosis not present

## 2021-03-20 DIAGNOSIS — E785 Hyperlipidemia, unspecified: Secondary | ICD-10-CM | POA: Diagnosis not present

## 2021-03-20 DIAGNOSIS — S2241XD Multiple fractures of ribs, right side, subsequent encounter for fracture with routine healing: Secondary | ICD-10-CM | POA: Diagnosis not present

## 2021-03-20 DIAGNOSIS — I051 Rheumatic mitral insufficiency: Secondary | ICD-10-CM | POA: Diagnosis not present

## 2021-03-20 DIAGNOSIS — Z7982 Long term (current) use of aspirin: Secondary | ICD-10-CM | POA: Diagnosis not present

## 2021-03-25 DIAGNOSIS — Z856 Personal history of leukemia: Secondary | ICD-10-CM | POA: Diagnosis not present

## 2021-03-25 DIAGNOSIS — H532 Diplopia: Secondary | ICD-10-CM | POA: Diagnosis not present

## 2021-03-25 DIAGNOSIS — I509 Heart failure, unspecified: Secondary | ICD-10-CM | POA: Diagnosis not present

## 2021-03-25 DIAGNOSIS — M8580 Other specified disorders of bone density and structure, unspecified site: Secondary | ICD-10-CM | POA: Diagnosis not present

## 2021-03-25 DIAGNOSIS — D696 Thrombocytopenia, unspecified: Secondary | ICD-10-CM | POA: Diagnosis not present

## 2021-03-25 DIAGNOSIS — Z853 Personal history of malignant neoplasm of breast: Secondary | ICD-10-CM | POA: Diagnosis not present

## 2021-03-25 DIAGNOSIS — Z7982 Long term (current) use of aspirin: Secondary | ICD-10-CM | POA: Diagnosis not present

## 2021-03-25 DIAGNOSIS — M2042 Other hammer toe(s) (acquired), left foot: Secondary | ICD-10-CM | POA: Diagnosis not present

## 2021-03-25 DIAGNOSIS — K5790 Diverticulosis of intestine, part unspecified, without perforation or abscess without bleeding: Secondary | ICD-10-CM | POA: Diagnosis not present

## 2021-03-25 DIAGNOSIS — S2241XD Multiple fractures of ribs, right side, subsequent encounter for fracture with routine healing: Secondary | ICD-10-CM | POA: Diagnosis not present

## 2021-03-25 DIAGNOSIS — I110 Hypertensive heart disease with heart failure: Secondary | ICD-10-CM | POA: Diagnosis not present

## 2021-03-25 DIAGNOSIS — Z9012 Acquired absence of left breast and nipple: Secondary | ICD-10-CM | POA: Diagnosis not present

## 2021-03-25 DIAGNOSIS — R69 Illness, unspecified: Secondary | ICD-10-CM | POA: Diagnosis not present

## 2021-03-25 DIAGNOSIS — E785 Hyperlipidemia, unspecified: Secondary | ICD-10-CM | POA: Diagnosis not present

## 2021-03-25 DIAGNOSIS — I051 Rheumatic mitral insufficiency: Secondary | ICD-10-CM | POA: Diagnosis not present

## 2021-03-27 DIAGNOSIS — Z856 Personal history of leukemia: Secondary | ICD-10-CM | POA: Diagnosis not present

## 2021-03-27 DIAGNOSIS — I509 Heart failure, unspecified: Secondary | ICD-10-CM | POA: Diagnosis not present

## 2021-03-27 DIAGNOSIS — K5790 Diverticulosis of intestine, part unspecified, without perforation or abscess without bleeding: Secondary | ICD-10-CM | POA: Diagnosis not present

## 2021-03-27 DIAGNOSIS — M2042 Other hammer toe(s) (acquired), left foot: Secondary | ICD-10-CM | POA: Diagnosis not present

## 2021-03-27 DIAGNOSIS — R69 Illness, unspecified: Secondary | ICD-10-CM | POA: Diagnosis not present

## 2021-03-27 DIAGNOSIS — Z7982 Long term (current) use of aspirin: Secondary | ICD-10-CM | POA: Diagnosis not present

## 2021-03-27 DIAGNOSIS — D696 Thrombocytopenia, unspecified: Secondary | ICD-10-CM | POA: Diagnosis not present

## 2021-03-27 DIAGNOSIS — E785 Hyperlipidemia, unspecified: Secondary | ICD-10-CM | POA: Diagnosis not present

## 2021-03-27 DIAGNOSIS — S2241XD Multiple fractures of ribs, right side, subsequent encounter for fracture with routine healing: Secondary | ICD-10-CM | POA: Diagnosis not present

## 2021-03-27 DIAGNOSIS — M8580 Other specified disorders of bone density and structure, unspecified site: Secondary | ICD-10-CM | POA: Diagnosis not present

## 2021-03-27 DIAGNOSIS — I051 Rheumatic mitral insufficiency: Secondary | ICD-10-CM | POA: Diagnosis not present

## 2021-03-27 DIAGNOSIS — Z853 Personal history of malignant neoplasm of breast: Secondary | ICD-10-CM | POA: Diagnosis not present

## 2021-03-27 DIAGNOSIS — H532 Diplopia: Secondary | ICD-10-CM | POA: Diagnosis not present

## 2021-03-27 DIAGNOSIS — I110 Hypertensive heart disease with heart failure: Secondary | ICD-10-CM | POA: Diagnosis not present

## 2021-03-27 DIAGNOSIS — Z9012 Acquired absence of left breast and nipple: Secondary | ICD-10-CM | POA: Diagnosis not present

## 2021-04-01 DIAGNOSIS — I110 Hypertensive heart disease with heart failure: Secondary | ICD-10-CM | POA: Diagnosis not present

## 2021-04-01 DIAGNOSIS — Z856 Personal history of leukemia: Secondary | ICD-10-CM | POA: Diagnosis not present

## 2021-04-01 DIAGNOSIS — Z7982 Long term (current) use of aspirin: Secondary | ICD-10-CM | POA: Diagnosis not present

## 2021-04-01 DIAGNOSIS — M8580 Other specified disorders of bone density and structure, unspecified site: Secondary | ICD-10-CM | POA: Diagnosis not present

## 2021-04-01 DIAGNOSIS — K5790 Diverticulosis of intestine, part unspecified, without perforation or abscess without bleeding: Secondary | ICD-10-CM | POA: Diagnosis not present

## 2021-04-01 DIAGNOSIS — S2241XD Multiple fractures of ribs, right side, subsequent encounter for fracture with routine healing: Secondary | ICD-10-CM | POA: Diagnosis not present

## 2021-04-01 DIAGNOSIS — I509 Heart failure, unspecified: Secondary | ICD-10-CM | POA: Diagnosis not present

## 2021-04-01 DIAGNOSIS — E785 Hyperlipidemia, unspecified: Secondary | ICD-10-CM | POA: Diagnosis not present

## 2021-04-01 DIAGNOSIS — M2042 Other hammer toe(s) (acquired), left foot: Secondary | ICD-10-CM | POA: Diagnosis not present

## 2021-04-01 DIAGNOSIS — Z853 Personal history of malignant neoplasm of breast: Secondary | ICD-10-CM | POA: Diagnosis not present

## 2021-04-01 DIAGNOSIS — D696 Thrombocytopenia, unspecified: Secondary | ICD-10-CM | POA: Diagnosis not present

## 2021-04-01 DIAGNOSIS — Z9012 Acquired absence of left breast and nipple: Secondary | ICD-10-CM | POA: Diagnosis not present

## 2021-04-01 DIAGNOSIS — R69 Illness, unspecified: Secondary | ICD-10-CM | POA: Diagnosis not present

## 2021-04-01 DIAGNOSIS — I051 Rheumatic mitral insufficiency: Secondary | ICD-10-CM | POA: Diagnosis not present

## 2021-04-01 DIAGNOSIS — H532 Diplopia: Secondary | ICD-10-CM | POA: Diagnosis not present

## 2021-04-05 ENCOUNTER — Telehealth: Payer: Self-pay | Admitting: Podiatry

## 2021-04-05 DIAGNOSIS — L84 Corns and callosities: Secondary | ICD-10-CM | POA: Diagnosis not present

## 2021-04-05 DIAGNOSIS — M2042 Other hammer toe(s) (acquired), left foot: Secondary | ICD-10-CM | POA: Diagnosis not present

## 2021-04-05 MED ORDER — OXYCODONE HCL 5 MG PO TABS: 5.0000 mg | ORAL_TABLET | ORAL | 0 refills | Status: AC | PRN

## 2021-04-05 MED ORDER — ACETAMINOPHEN 500 MG PO TABS: 1000.0000 mg | ORAL_TABLET | Freq: Four times a day (QID) | ORAL | 0 refills | Status: AC | PRN

## 2021-04-05 NOTE — Telephone Encounter (Signed)
04/05/21 [facility] L 2nd toe arthroplasty

## 2021-04-11 ENCOUNTER — Ambulatory Visit (INDEPENDENT_AMBULATORY_CARE_PROVIDER_SITE_OTHER): Payer: Medicare HMO | Admitting: Podiatry

## 2021-04-11 ENCOUNTER — Other Ambulatory Visit: Payer: Self-pay

## 2021-04-11 ENCOUNTER — Ambulatory Visit (INDEPENDENT_AMBULATORY_CARE_PROVIDER_SITE_OTHER): Payer: Medicare HMO

## 2021-04-11 DIAGNOSIS — M21612 Bunion of left foot: Secondary | ICD-10-CM

## 2021-04-11 DIAGNOSIS — M2012 Hallux valgus (acquired), left foot: Secondary | ICD-10-CM | POA: Diagnosis not present

## 2021-04-11 DIAGNOSIS — M2042 Other hammer toe(s) (acquired), left foot: Secondary | ICD-10-CM

## 2021-04-11 DIAGNOSIS — Z9889 Other specified postprocedural states: Secondary | ICD-10-CM

## 2021-04-15 DIAGNOSIS — Z856 Personal history of leukemia: Secondary | ICD-10-CM | POA: Diagnosis not present

## 2021-04-15 DIAGNOSIS — K5790 Diverticulosis of intestine, part unspecified, without perforation or abscess without bleeding: Secondary | ICD-10-CM | POA: Diagnosis not present

## 2021-04-15 DIAGNOSIS — Z853 Personal history of malignant neoplasm of breast: Secondary | ICD-10-CM | POA: Diagnosis not present

## 2021-04-15 DIAGNOSIS — I110 Hypertensive heart disease with heart failure: Secondary | ICD-10-CM | POA: Diagnosis not present

## 2021-04-15 DIAGNOSIS — R69 Illness, unspecified: Secondary | ICD-10-CM | POA: Diagnosis not present

## 2021-04-15 DIAGNOSIS — D696 Thrombocytopenia, unspecified: Secondary | ICD-10-CM | POA: Diagnosis not present

## 2021-04-15 DIAGNOSIS — Z7982 Long term (current) use of aspirin: Secondary | ICD-10-CM | POA: Diagnosis not present

## 2021-04-15 DIAGNOSIS — M2042 Other hammer toe(s) (acquired), left foot: Secondary | ICD-10-CM | POA: Diagnosis not present

## 2021-04-15 DIAGNOSIS — H532 Diplopia: Secondary | ICD-10-CM | POA: Diagnosis not present

## 2021-04-15 DIAGNOSIS — S2241XD Multiple fractures of ribs, right side, subsequent encounter for fracture with routine healing: Secondary | ICD-10-CM | POA: Diagnosis not present

## 2021-04-15 DIAGNOSIS — I509 Heart failure, unspecified: Secondary | ICD-10-CM | POA: Diagnosis not present

## 2021-04-15 DIAGNOSIS — E785 Hyperlipidemia, unspecified: Secondary | ICD-10-CM | POA: Diagnosis not present

## 2021-04-15 DIAGNOSIS — Z9012 Acquired absence of left breast and nipple: Secondary | ICD-10-CM | POA: Diagnosis not present

## 2021-04-15 DIAGNOSIS — I051 Rheumatic mitral insufficiency: Secondary | ICD-10-CM | POA: Diagnosis not present

## 2021-04-15 DIAGNOSIS — M8580 Other specified disorders of bone density and structure, unspecified site: Secondary | ICD-10-CM | POA: Diagnosis not present

## 2021-04-16 ENCOUNTER — Encounter: Payer: Self-pay | Admitting: Podiatry

## 2021-04-16 NOTE — Progress Notes (Signed)
  Subjective:  Patient ID: Heather Knapp, female    DOB: May 18, 1934,  MRN: [id number]  Chief Complaint  Patient presents with  . Routine Post Op     (xray)POV #1 DOS 04/05/2021 HAMMERTOE CORRECTION 2ND LT TOE    DOS: 04/05/21 Procedure: arthroplasty L 2nd toe  87 y.o. female returns for post-op check. Doing well minimal pain, here with her daughter  Review of Systems: Negative except as noted in the HPI. Denies N/V/F/Ch.   Objective:  There were no vitals filed for this visit. There is no height or weight on file to calculate BMI. Constitutional Well developed. Well nourished.  Vascular Foot warm and well perfused. Capillary refill normal to all digits.   Neurologic Normal speech. Oriented to person, place, and time. Epicritic sensation to light touch grossly present bilaterally.  Dermatologic Skin healing well without signs of infection. Skin edges well coapted without signs of infection.  Orthopedic: Tenderness to palpation noted about the surgical site.   Radiographs: s/p arthroplasty L 2nd toe Assessment:   1. Hallux valgus with bunions, left   2. Hammertoe of left foot   3. Post-operative state    Plan:  Patient was evaluated and treated and all questions answered.  S/p foot surgery left -Progressing as expected post-operatively. -XR: as above -WB Status: WBAT in sx shoe -Sutures: remove in 2 weeks.  -Foot redressed.  Return in about 2 weeks (around 04/25/2021) for suture removal .

## 2021-04-18 ENCOUNTER — Encounter: Payer: Medicare HMO | Admitting: Podiatry

## 2021-04-19 ENCOUNTER — Other Ambulatory Visit: Payer: Self-pay

## 2021-04-19 ENCOUNTER — Ambulatory Visit (INDEPENDENT_AMBULATORY_CARE_PROVIDER_SITE_OTHER): Payer: Medicare HMO | Admitting: Podiatry

## 2021-04-19 DIAGNOSIS — M2042 Other hammer toe(s) (acquired), left foot: Secondary | ICD-10-CM

## 2021-04-19 DIAGNOSIS — M2012 Hallux valgus (acquired), left foot: Secondary | ICD-10-CM

## 2021-04-19 DIAGNOSIS — Z9889 Other specified postprocedural states: Secondary | ICD-10-CM

## 2021-04-19 DIAGNOSIS — M21612 Bunion of left foot: Secondary | ICD-10-CM

## 2021-04-22 DIAGNOSIS — Z9012 Acquired absence of left breast and nipple: Secondary | ICD-10-CM | POA: Diagnosis not present

## 2021-04-22 DIAGNOSIS — D696 Thrombocytopenia, unspecified: Secondary | ICD-10-CM | POA: Diagnosis not present

## 2021-04-22 DIAGNOSIS — I509 Heart failure, unspecified: Secondary | ICD-10-CM | POA: Diagnosis not present

## 2021-04-22 DIAGNOSIS — M2042 Other hammer toe(s) (acquired), left foot: Secondary | ICD-10-CM | POA: Diagnosis not present

## 2021-04-22 DIAGNOSIS — Z853 Personal history of malignant neoplasm of breast: Secondary | ICD-10-CM | POA: Diagnosis not present

## 2021-04-22 DIAGNOSIS — R69 Illness, unspecified: Secondary | ICD-10-CM | POA: Diagnosis not present

## 2021-04-22 DIAGNOSIS — E785 Hyperlipidemia, unspecified: Secondary | ICD-10-CM | POA: Diagnosis not present

## 2021-04-22 DIAGNOSIS — S2241XD Multiple fractures of ribs, right side, subsequent encounter for fracture with routine healing: Secondary | ICD-10-CM | POA: Diagnosis not present

## 2021-04-22 DIAGNOSIS — M8580 Other specified disorders of bone density and structure, unspecified site: Secondary | ICD-10-CM | POA: Diagnosis not present

## 2021-04-22 DIAGNOSIS — H532 Diplopia: Secondary | ICD-10-CM | POA: Diagnosis not present

## 2021-04-22 DIAGNOSIS — Z856 Personal history of leukemia: Secondary | ICD-10-CM | POA: Diagnosis not present

## 2021-04-22 DIAGNOSIS — Z7982 Long term (current) use of aspirin: Secondary | ICD-10-CM | POA: Diagnosis not present

## 2021-04-22 DIAGNOSIS — K5790 Diverticulosis of intestine, part unspecified, without perforation or abscess without bleeding: Secondary | ICD-10-CM | POA: Diagnosis not present

## 2021-04-22 DIAGNOSIS — I110 Hypertensive heart disease with heart failure: Secondary | ICD-10-CM | POA: Diagnosis not present

## 2021-04-22 DIAGNOSIS — I051 Rheumatic mitral insufficiency: Secondary | ICD-10-CM | POA: Diagnosis not present

## 2021-04-23 NOTE — Progress Notes (Signed)
  Subjective:  Patient ID: Heather Knapp, female    DOB: 05-01-1934,  MRN: [id number]  Chief Complaint  Patient presents with  . Routine Post Op    POV #2 DOS 04/05/2021 HAMMERTOE CORRECTION 2ND LT TOE Tylenol not helping w/ pain. Denies f/c/n/v. Swelling.     DOS: 04/05/21 Procedure: arthroplasty L 2nd toe  87 y.o. female returns for post-op check. Doing well minimal pain, here with her daughter  Review of Systems: Negative except as noted in the HPI. Denies N/V/F/Ch.   Objective:  There were no vitals filed for this visit. There is no height or weight on file to calculate BMI. Constitutional Well developed. Well nourished.  Vascular Foot warm and well perfused. Capillary refill normal to all digits.   Neurologic Normal speech. Oriented to person, place, and time. Epicritic sensation to light touch grossly present bilaterally.  Dermatologic  skin completely epithelialized.  No clinical signs of infection noted.  Orthopedic: Tenderness to palpation noted about the surgical site.   Radiographs: s/p arthroplasty L 2nd toe Assessment:   1. Hallux valgus with bunions, left   2. Hammertoe of left foot   3. Post-operative state    Plan:  Patient was evaluated and treated and all questions answered.  S/p foot surgery left -Progressing as expected post-operatively. -XR: as above -WB Status: WBAT in sx shoe -Sutures: Removed.  No signs of dehiscence noted.  No clinical signs of infection noted.  -Foot redressed.  No follow-ups on file.

## 2021-05-01 DIAGNOSIS — I110 Hypertensive heart disease with heart failure: Secondary | ICD-10-CM | POA: Diagnosis not present

## 2021-05-01 DIAGNOSIS — R69 Illness, unspecified: Secondary | ICD-10-CM | POA: Diagnosis not present

## 2021-05-01 DIAGNOSIS — S2241XD Multiple fractures of ribs, right side, subsequent encounter for fracture with routine healing: Secondary | ICD-10-CM | POA: Diagnosis not present

## 2021-05-01 DIAGNOSIS — Z853 Personal history of malignant neoplasm of breast: Secondary | ICD-10-CM | POA: Diagnosis not present

## 2021-05-01 DIAGNOSIS — E785 Hyperlipidemia, unspecified: Secondary | ICD-10-CM | POA: Diagnosis not present

## 2021-05-01 DIAGNOSIS — D696 Thrombocytopenia, unspecified: Secondary | ICD-10-CM | POA: Diagnosis not present

## 2021-05-01 DIAGNOSIS — I509 Heart failure, unspecified: Secondary | ICD-10-CM | POA: Diagnosis not present

## 2021-05-01 DIAGNOSIS — H532 Diplopia: Secondary | ICD-10-CM | POA: Diagnosis not present

## 2021-05-01 DIAGNOSIS — Z856 Personal history of leukemia: Secondary | ICD-10-CM | POA: Diagnosis not present

## 2021-05-01 DIAGNOSIS — K5790 Diverticulosis of intestine, part unspecified, without perforation or abscess without bleeding: Secondary | ICD-10-CM | POA: Diagnosis not present

## 2021-05-01 DIAGNOSIS — Z7982 Long term (current) use of aspirin: Secondary | ICD-10-CM | POA: Diagnosis not present

## 2021-05-01 DIAGNOSIS — M8580 Other specified disorders of bone density and structure, unspecified site: Secondary | ICD-10-CM | POA: Diagnosis not present

## 2021-05-01 DIAGNOSIS — M2042 Other hammer toe(s) (acquired), left foot: Secondary | ICD-10-CM | POA: Diagnosis not present

## 2021-05-01 DIAGNOSIS — I051 Rheumatic mitral insufficiency: Secondary | ICD-10-CM | POA: Diagnosis not present

## 2021-05-01 DIAGNOSIS — Z9012 Acquired absence of left breast and nipple: Secondary | ICD-10-CM | POA: Diagnosis not present

## 2021-05-07 ENCOUNTER — Ambulatory Visit (INDEPENDENT_AMBULATORY_CARE_PROVIDER_SITE_OTHER): Payer: Medicare HMO | Admitting: Podiatry

## 2021-05-07 ENCOUNTER — Ambulatory Visit (INDEPENDENT_AMBULATORY_CARE_PROVIDER_SITE_OTHER): Payer: Medicare HMO

## 2021-05-07 ENCOUNTER — Encounter: Payer: Self-pay | Admitting: Podiatry

## 2021-05-07 ENCOUNTER — Other Ambulatory Visit: Payer: Self-pay

## 2021-05-07 DIAGNOSIS — M2012 Hallux valgus (acquired), left foot: Secondary | ICD-10-CM | POA: Diagnosis not present

## 2021-05-07 DIAGNOSIS — M21612 Bunion of left foot: Secondary | ICD-10-CM

## 2021-05-07 DIAGNOSIS — M2042 Other hammer toe(s) (acquired), left foot: Secondary | ICD-10-CM

## 2021-05-07 DIAGNOSIS — Z9889 Other specified postprocedural states: Secondary | ICD-10-CM

## 2021-05-07 NOTE — Progress Notes (Signed)
  Subjective:  Patient ID: Heather Knapp, female    DOB: 1934/10/27,  MRN: [id number]  Chief Complaint  Patient presents with  . Routine Post Op    POV #3 DOS 04/05/2021 HAMMERTOE CORRECTION 2ND LT TOE    DOS: 04/05/21 Procedure: arthroplasty L 2nd toe  87 y.o. female returns for post-op check. Doing well with minimal pain, here with her daughter.  She has have issues with thickened nails that she has difficulty cutting  Review of Systems: Negative except as noted in the HPI. Denies N/V/F/Ch.   Objective:  There were no vitals filed for this visit. There is no height or weight on file to calculate BMI. Constitutional Well developed. Well nourished.  Vascular Foot warm and well perfused. Capillary refill normal to all digits.   Neurologic Normal speech. Oriented to person, place, and time. Epicritic sensation to light touch grossly present bilaterally.  Dermatologic  well-healed surgical incision without recurrence of corn  Orthopedic:  Mild edema she has no tenderness to palpation noted about the surgical site.   Radiographs: s/p arthroplasty L 2nd toe Assessment:   1. Hallux valgus with bunions, left   2. Hammertoe of left foot   3. Post-operative state    Plan:  Patient was evaluated and treated and all questions answered.  S/p foot surgery left -Discussed that the toe will probably continue to be swollen and mildly tender for the next 1 to 2 months.  Overall has healed well and hopefully does not recur with lesion return to see me as needed for this  She does have nail dystrophy and onychomycosis and has difficulty cutting them and they are very painful.  We will have her set up for regular routine care.  Return if symptoms worsen or fail to improve.

## 2021-05-13 ENCOUNTER — Ambulatory Visit: Payer: Medicare HMO | Admitting: Neurology

## 2021-05-14 ENCOUNTER — Ambulatory Visit: Payer: Medicare HMO | Admitting: Neurology

## 2021-05-14 ENCOUNTER — Encounter: Payer: Self-pay | Admitting: Neurology

## 2021-05-14 VITALS — BP 157/71 | HR 79 | Ht 62.0 in | Wt 147.0 lb

## 2021-05-14 DIAGNOSIS — I5032 Chronic diastolic (congestive) heart failure: Secondary | ICD-10-CM | POA: Diagnosis not present

## 2021-05-14 DIAGNOSIS — F0390 Unspecified dementia without behavioral disturbance: Secondary | ICD-10-CM

## 2021-05-14 DIAGNOSIS — I10 Essential (primary) hypertension: Secondary | ICD-10-CM | POA: Diagnosis not present

## 2021-05-14 DIAGNOSIS — C9110 Chronic lymphocytic leukemia of B-cell type not having achieved remission: Secondary | ICD-10-CM | POA: Diagnosis not present

## 2021-05-14 DIAGNOSIS — R69 Illness, unspecified: Secondary | ICD-10-CM | POA: Diagnosis not present

## 2021-05-14 DIAGNOSIS — R269 Unspecified abnormalities of gait and mobility: Secondary | ICD-10-CM | POA: Diagnosis not present

## 2021-05-14 NOTE — Patient Instructions (Addendum)
Continue the Namenda at current dosing  Try to get involved in silver sneakers  See you back in 6 months
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
## 2021-05-22 DIAGNOSIS — H532 Diplopia: Secondary | ICD-10-CM | POA: Diagnosis not present

## 2021-05-22 DIAGNOSIS — I110 Hypertensive heart disease with heart failure: Secondary | ICD-10-CM | POA: Diagnosis not present

## 2021-05-22 DIAGNOSIS — Z9012 Acquired absence of left breast and nipple: Secondary | ICD-10-CM | POA: Diagnosis not present

## 2021-05-22 DIAGNOSIS — K5790 Diverticulosis of intestine, part unspecified, without perforation or abscess without bleeding: Secondary | ICD-10-CM | POA: Diagnosis not present

## 2021-05-22 DIAGNOSIS — M8580 Other specified disorders of bone density and structure, unspecified site: Secondary | ICD-10-CM | POA: Diagnosis not present

## 2021-05-22 DIAGNOSIS — D696 Thrombocytopenia, unspecified: Secondary | ICD-10-CM | POA: Diagnosis not present

## 2021-05-22 DIAGNOSIS — Z856 Personal history of leukemia: Secondary | ICD-10-CM | POA: Diagnosis not present

## 2021-05-22 DIAGNOSIS — S2241XD Multiple fractures of ribs, right side, subsequent encounter for fracture with routine healing: Secondary | ICD-10-CM | POA: Diagnosis not present

## 2021-05-22 DIAGNOSIS — R69 Illness, unspecified: Secondary | ICD-10-CM | POA: Diagnosis not present

## 2021-05-22 DIAGNOSIS — E785 Hyperlipidemia, unspecified: Secondary | ICD-10-CM | POA: Diagnosis not present

## 2021-05-22 DIAGNOSIS — I509 Heart failure, unspecified: Secondary | ICD-10-CM | POA: Diagnosis not present

## 2021-05-22 DIAGNOSIS — Z7982 Long term (current) use of aspirin: Secondary | ICD-10-CM | POA: Diagnosis not present

## 2021-05-22 DIAGNOSIS — Z4789 Encounter for other orthopedic aftercare: Secondary | ICD-10-CM | POA: Diagnosis not present

## 2021-05-22 DIAGNOSIS — I051 Rheumatic mitral insufficiency: Secondary | ICD-10-CM | POA: Diagnosis not present

## 2021-05-22 DIAGNOSIS — Z853 Personal history of malignant neoplasm of breast: Secondary | ICD-10-CM | POA: Diagnosis not present

## 2021-05-29 DIAGNOSIS — I509 Heart failure, unspecified: Secondary | ICD-10-CM | POA: Diagnosis not present

## 2021-05-29 DIAGNOSIS — M8580 Other specified disorders of bone density and structure, unspecified site: Secondary | ICD-10-CM | POA: Diagnosis not present

## 2021-05-29 DIAGNOSIS — Z4789 Encounter for other orthopedic aftercare: Secondary | ICD-10-CM | POA: Diagnosis not present

## 2021-05-29 DIAGNOSIS — Z9012 Acquired absence of left breast and nipple: Secondary | ICD-10-CM | POA: Diagnosis not present

## 2021-05-29 DIAGNOSIS — I051 Rheumatic mitral insufficiency: Secondary | ICD-10-CM | POA: Diagnosis not present

## 2021-05-29 DIAGNOSIS — I110 Hypertensive heart disease with heart failure: Secondary | ICD-10-CM | POA: Diagnosis not present

## 2021-05-29 DIAGNOSIS — Z853 Personal history of malignant neoplasm of breast: Secondary | ICD-10-CM | POA: Diagnosis not present

## 2021-05-29 DIAGNOSIS — R69 Illness, unspecified: Secondary | ICD-10-CM | POA: Diagnosis not present

## 2021-05-29 DIAGNOSIS — E785 Hyperlipidemia, unspecified: Secondary | ICD-10-CM | POA: Diagnosis not present

## 2021-05-29 DIAGNOSIS — H532 Diplopia: Secondary | ICD-10-CM | POA: Diagnosis not present

## 2021-05-29 DIAGNOSIS — Z856 Personal history of leukemia: Secondary | ICD-10-CM | POA: Diagnosis not present

## 2021-05-29 DIAGNOSIS — D696 Thrombocytopenia, unspecified: Secondary | ICD-10-CM | POA: Diagnosis not present

## 2021-05-29 DIAGNOSIS — S2241XD Multiple fractures of ribs, right side, subsequent encounter for fracture with routine healing: Secondary | ICD-10-CM | POA: Diagnosis not present

## 2021-05-29 DIAGNOSIS — Z7982 Long term (current) use of aspirin: Secondary | ICD-10-CM | POA: Diagnosis not present

## 2021-05-29 DIAGNOSIS — K5790 Diverticulosis of intestine, part unspecified, without perforation or abscess without bleeding: Secondary | ICD-10-CM | POA: Diagnosis not present

## 2021-05-31 ENCOUNTER — Ambulatory Visit: Payer: Medicare HMO | Admitting: Cardiology

## 2021-05-31 ENCOUNTER — Encounter: Payer: Self-pay | Admitting: Cardiology

## 2021-05-31 ENCOUNTER — Other Ambulatory Visit: Payer: Self-pay

## 2021-05-31 VITALS — BP 130/80 | HR 68 | Resp 18 | Ht 62.0 in | Wt 147.0 lb

## 2021-05-31 DIAGNOSIS — I341 Nonrheumatic mitral (valve) prolapse: Secondary | ICD-10-CM

## 2021-05-31 DIAGNOSIS — G453 Amaurosis fugax: Secondary | ICD-10-CM

## 2021-05-31 DIAGNOSIS — I340 Nonrheumatic mitral (valve) insufficiency: Secondary | ICD-10-CM

## 2021-05-31 DIAGNOSIS — I5032 Chronic diastolic (congestive) heart failure: Secondary | ICD-10-CM

## 2021-05-31 DIAGNOSIS — R011 Cardiac murmur, unspecified: Secondary | ICD-10-CM | POA: Diagnosis not present

## 2021-05-31 DIAGNOSIS — C50912 Malignant neoplasm of unspecified site of left female breast: Secondary | ICD-10-CM | POA: Diagnosis not present

## 2021-05-31 DIAGNOSIS — I10 Essential (primary) hypertension: Secondary | ICD-10-CM

## 2021-05-31 MED ORDER — METOPROLOL SUCCINATE ER 50 MG PO TB24: ORAL_TABLET | ORAL | 3 refills | Status: DC

## 2021-05-31 NOTE — Patient Instructions (Addendum)
Medication Instructions:  Okay to hold aspirin for 3 days for bad bruising  Change Toprol XI  ( metoprolol succinate)  to 50 mg twice a day  at noon and bedtime   *If you need a refill on your cardiac medications before your next appointment, please call your pharmacy*   Lab Work: Not needed  If you have labs (blood work) drawn today and your tests are completely normal, you will receive your results only by: [place] (if you have MyChart) OR A paper copy in the mail If you have any lab test that is abnormal or we need to change your treatment, we will call you to review the results.   Testing/Procedures: Will  be schedule at [facility] [address]- Oct 2022 Your physician has requested that you have an echocardiogram. Echocardiography is a painless test that uses sound waves to create images of your heart. It provides your doctor with information about the size and shape of your heart and how well your heart's chambers and valves are working. This procedure takes approximately one hour. There are no restrictions for this procedure.     Follow-Up: At [facility], you and your health needs are our priority.  As part of our continuing mission to provide you with exceptional heart care, we have created designated Provider Care Teams.  These Care Teams include your primary Cardiologist (physician) and Advanced Practice Providers (APPs -  Physician Assistants and Nurse Practitioners) who all work together to provide you with the care you need, when you need it.     Your next appointment:   6 month(s)  The format for your next appointment:   Virtual Visit   Provider:   Glenetta Hew, MD   Other Instructions
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
## 2021-06-05 DIAGNOSIS — D696 Thrombocytopenia, unspecified: Secondary | ICD-10-CM | POA: Diagnosis not present

## 2021-06-05 DIAGNOSIS — K5790 Diverticulosis of intestine, part unspecified, without perforation or abscess without bleeding: Secondary | ICD-10-CM | POA: Diagnosis not present

## 2021-06-05 DIAGNOSIS — M8580 Other specified disorders of bone density and structure, unspecified site: Secondary | ICD-10-CM | POA: Diagnosis not present

## 2021-06-05 DIAGNOSIS — I110 Hypertensive heart disease with heart failure: Secondary | ICD-10-CM | POA: Diagnosis not present

## 2021-06-05 DIAGNOSIS — I509 Heart failure, unspecified: Secondary | ICD-10-CM | POA: Diagnosis not present

## 2021-06-05 DIAGNOSIS — I051 Rheumatic mitral insufficiency: Secondary | ICD-10-CM | POA: Diagnosis not present

## 2021-06-05 DIAGNOSIS — Z4789 Encounter for other orthopedic aftercare: Secondary | ICD-10-CM | POA: Diagnosis not present

## 2021-06-05 DIAGNOSIS — S2241XD Multiple fractures of ribs, right side, subsequent encounter for fracture with routine healing: Secondary | ICD-10-CM | POA: Diagnosis not present

## 2021-06-05 DIAGNOSIS — R69 Illness, unspecified: Secondary | ICD-10-CM | POA: Diagnosis not present

## 2021-06-05 DIAGNOSIS — E785 Hyperlipidemia, unspecified: Secondary | ICD-10-CM | POA: Diagnosis not present

## 2021-06-06 ENCOUNTER — Encounter: Payer: Self-pay | Admitting: Cardiology

## 2021-06-06 NOTE — Assessment & Plan Note (Signed)
Difficult to hear but regurgitation in the setting of aortic sclerosis murmur.

## 2021-06-06 NOTE — Assessment & Plan Note (Signed)
Very transient episode last fall.  No further episodes of amaurosis fugax or other TIA.  She is on aspirin, and is having some bruising.  Okay to hold 2 to 3 days for bruising.  Also can hold 5 to 7 days preop for surgical procedures.

## 2021-06-06 NOTE — Assessment & Plan Note (Addendum)
Euvolemic on exam.  No PND or orthopnea.  No edema.  Probably more Fatigue and dyspnea.  May partly be related to mild regurgitation. Blood pressure is pretty good and would not get overly aggressive.  With her having some fatigue we will have her split her Toprol 100 mg tablets into 1/2 tablet (Toprol 50 mg at noon and bedtime) BID and continue ARB.  No diuretic requirement

## 2021-06-06 NOTE — Assessment & Plan Note (Signed)
Interestingly it was echo sent posterior leaflet prolapse and a second set anterior prolapse.  Given her significant with moderate regurgitation.  Berry auscultated on exam-partially because of aortic sclerosis murmur.  Plan: Recheck echo in October to reassess based on progression of prolapse/murmur.

## 2021-06-06 NOTE — Assessment & Plan Note (Addendum)
Patient progressed to moderate regurgitation with mitral prolapse.  She not really symptomatic.  Plan: Likely be rechecked follow-up echocardiogram in October -> if stable at that time, would probably wait 2 years

## 2021-06-11 DIAGNOSIS — K5790 Diverticulosis of intestine, part unspecified, without perforation or abscess without bleeding: Secondary | ICD-10-CM | POA: Diagnosis not present

## 2021-06-11 DIAGNOSIS — I110 Hypertensive heart disease with heart failure: Secondary | ICD-10-CM | POA: Diagnosis not present

## 2021-06-11 DIAGNOSIS — S2241XD Multiple fractures of ribs, right side, subsequent encounter for fracture with routine healing: Secondary | ICD-10-CM | POA: Diagnosis not present

## 2021-06-11 DIAGNOSIS — I509 Heart failure, unspecified: Secondary | ICD-10-CM | POA: Diagnosis not present

## 2021-06-11 DIAGNOSIS — M8580 Other specified disorders of bone density and structure, unspecified site: Secondary | ICD-10-CM | POA: Diagnosis not present

## 2021-06-11 DIAGNOSIS — E785 Hyperlipidemia, unspecified: Secondary | ICD-10-CM | POA: Diagnosis not present

## 2021-06-11 DIAGNOSIS — R69 Illness, unspecified: Secondary | ICD-10-CM | POA: Diagnosis not present

## 2021-06-11 DIAGNOSIS — I051 Rheumatic mitral insufficiency: Secondary | ICD-10-CM | POA: Diagnosis not present

## 2021-06-11 DIAGNOSIS — D696 Thrombocytopenia, unspecified: Secondary | ICD-10-CM | POA: Diagnosis not present

## 2021-06-11 DIAGNOSIS — Z4789 Encounter for other orthopedic aftercare: Secondary | ICD-10-CM | POA: Diagnosis not present

## 2021-06-21 ENCOUNTER — Ambulatory Visit: Payer: Medicare HMO | Admitting: Podiatry

## 2021-06-21 ENCOUNTER — Other Ambulatory Visit: Payer: Self-pay

## 2021-06-21 ENCOUNTER — Encounter: Payer: Self-pay | Admitting: Podiatry

## 2021-06-21 DIAGNOSIS — M79674 Pain in right toe(s): Secondary | ICD-10-CM | POA: Diagnosis not present

## 2021-06-21 DIAGNOSIS — R69 Illness, unspecified: Secondary | ICD-10-CM | POA: Diagnosis not present

## 2021-06-21 DIAGNOSIS — B351 Tinea unguium: Secondary | ICD-10-CM | POA: Diagnosis not present

## 2021-06-21 DIAGNOSIS — M2011 Hallux valgus (acquired), right foot: Secondary | ICD-10-CM

## 2021-06-21 DIAGNOSIS — S2241XD Multiple fractures of ribs, right side, subsequent encounter for fracture with routine healing: Secondary | ICD-10-CM | POA: Diagnosis not present

## 2021-06-21 DIAGNOSIS — D696 Thrombocytopenia, unspecified: Secondary | ICD-10-CM | POA: Diagnosis not present

## 2021-06-21 DIAGNOSIS — I509 Heart failure, unspecified: Secondary | ICD-10-CM | POA: Diagnosis not present

## 2021-06-21 DIAGNOSIS — M8580 Other specified disorders of bone density and structure, unspecified site: Secondary | ICD-10-CM | POA: Diagnosis not present

## 2021-06-21 DIAGNOSIS — Z4789 Encounter for other orthopedic aftercare: Secondary | ICD-10-CM | POA: Diagnosis not present

## 2021-06-21 DIAGNOSIS — K5790 Diverticulosis of intestine, part unspecified, without perforation or abscess without bleeding: Secondary | ICD-10-CM | POA: Diagnosis not present

## 2021-06-21 DIAGNOSIS — M2042 Other hammer toe(s) (acquired), left foot: Secondary | ICD-10-CM

## 2021-06-21 DIAGNOSIS — M79675 Pain in left toe(s): Secondary | ICD-10-CM

## 2021-06-21 DIAGNOSIS — I051 Rheumatic mitral insufficiency: Secondary | ICD-10-CM | POA: Diagnosis not present

## 2021-06-21 DIAGNOSIS — E785 Hyperlipidemia, unspecified: Secondary | ICD-10-CM | POA: Diagnosis not present

## 2021-06-21 DIAGNOSIS — I110 Hypertensive heart disease with heart failure: Secondary | ICD-10-CM | POA: Diagnosis not present

## 2021-06-21 DIAGNOSIS — M21611 Bunion of right foot: Secondary | ICD-10-CM

## 2021-06-21 NOTE — Progress Notes (Signed)
This patient returns to my office for at risk foot care.  This patient requires this care by a professional since this patient will be at risk due to having thrombocytopenia.  This patient is unable to cut nails herself since the patient cannot reach her nails.These nails are painful walking and wearing shoes.  She also has history of hammer toe surgery second toe left foot by Dr.  Sherryle Lis. This patient presents for at risk foot care today.  General Appearance  Alert, conversant and in no acute stress.  Vascular  Dorsalis pedis and posterior tibial  pulses are palpable  bilaterally.  Capillary return is within normal limits  bilaterally. Temperature is within normal limits  bilaterally.  Neurologic  Senn-Weinstein monofilament wire test within normal limits  bilaterally. Muscle power within normal limits bilaterally.  Nails Thick disfigured discolored nails with subungual debris  from hallux to fifth toes bilaterally. No evidence of bacterial infection or drainage bilaterally.  Orthopedic  No limitations of motion  feet .  No crepitus or effusions noted.  No bony pathology or digital deformities noted.HAV  B/L.  Hammer toes  B/L  Skin  normotropic skin with no porokeratosis noted bilaterally.  No signs of infections or ulcers noted.     Onychomycosis  Pain in right toes  Pain in left toes  Consent was obtained for treatment procedures.   Mechanical debridement of nails 1-5  bilaterally performed with a nail nipper.  Filed with dremel without incident.    Return office visit   prn                  Told patient to return for periodic foot care and evaluation due to potential at risk complications.   Gardiner Barefoot DPM

## 2021-06-26 DIAGNOSIS — S2241XD Multiple fractures of ribs, right side, subsequent encounter for fracture with routine healing: Secondary | ICD-10-CM | POA: Diagnosis not present

## 2021-06-26 DIAGNOSIS — I110 Hypertensive heart disease with heart failure: Secondary | ICD-10-CM | POA: Diagnosis not present

## 2021-06-26 DIAGNOSIS — I509 Heart failure, unspecified: Secondary | ICD-10-CM | POA: Diagnosis not present

## 2021-06-26 DIAGNOSIS — Z4789 Encounter for other orthopedic aftercare: Secondary | ICD-10-CM | POA: Diagnosis not present

## 2021-06-26 DIAGNOSIS — D696 Thrombocytopenia, unspecified: Secondary | ICD-10-CM | POA: Diagnosis not present

## 2021-06-26 DIAGNOSIS — E785 Hyperlipidemia, unspecified: Secondary | ICD-10-CM | POA: Diagnosis not present

## 2021-06-26 DIAGNOSIS — I051 Rheumatic mitral insufficiency: Secondary | ICD-10-CM | POA: Diagnosis not present

## 2021-06-26 DIAGNOSIS — R69 Illness, unspecified: Secondary | ICD-10-CM | POA: Diagnosis not present

## 2021-06-26 DIAGNOSIS — M8580 Other specified disorders of bone density and structure, unspecified site: Secondary | ICD-10-CM | POA: Diagnosis not present

## 2021-06-26 DIAGNOSIS — K5790 Diverticulosis of intestine, part unspecified, without perforation or abscess without bleeding: Secondary | ICD-10-CM | POA: Diagnosis not present

## 2021-07-03 ENCOUNTER — Other Ambulatory Visit: Payer: Self-pay | Admitting: *Deleted

## 2021-07-03 DIAGNOSIS — C9110 Chronic lymphocytic leukemia of B-cell type not having achieved remission: Secondary | ICD-10-CM

## 2021-07-04 ENCOUNTER — Other Ambulatory Visit: Payer: Self-pay

## 2021-07-04 ENCOUNTER — Inpatient Hospital Stay: Payer: Medicare HMO | Admitting: Hematology & Oncology

## 2021-07-04 ENCOUNTER — Encounter: Payer: Self-pay | Admitting: Hematology & Oncology

## 2021-07-04 ENCOUNTER — Inpatient Hospital Stay: Payer: Medicare HMO | Attending: Hematology & Oncology

## 2021-07-04 VITALS — BP 147/62 | HR 78 | Temp 98.2°F | Resp 18 | Wt 148.0 lb

## 2021-07-04 DIAGNOSIS — Z79899 Other long term (current) drug therapy: Secondary | ICD-10-CM | POA: Insufficient documentation

## 2021-07-04 DIAGNOSIS — Z853 Personal history of malignant neoplasm of breast: Secondary | ICD-10-CM | POA: Diagnosis not present

## 2021-07-04 DIAGNOSIS — Z7982 Long term (current) use of aspirin: Secondary | ICD-10-CM | POA: Diagnosis not present

## 2021-07-04 DIAGNOSIS — C9110 Chronic lymphocytic leukemia of B-cell type not having achieved remission: Secondary | ICD-10-CM | POA: Diagnosis not present

## 2021-07-04 LAB — CMP (CANCER CENTER ONLY)
ALT: 24 U/L (ref 0–44)
AST: 22 U/L (ref 15–41)
Albumin: 4.3 g/dL (ref 3.5–5.0)
Alkaline Phosphatase: 36 U/L — ABNORMAL LOW (ref 38–126)
Anion gap: 6 (ref 5–15)
BUN: 15 mg/dL (ref 8–23)
CO2: 28 mmol/L (ref 22–32)
Calcium: 9.2 mg/dL (ref 8.9–10.3)
Chloride: 97 mmol/L — ABNORMAL LOW (ref 98–111)
Creatinine: 0.78 mg/dL (ref 0.44–1.00)
GFR, Estimated: >60 mL/min (ref >60)
Glucose, Bld: 88 mg/dL (ref 70–99)
Potassium: 4.2 mmol/L (ref 3.5–5.1)
Sodium: 131 mmol/L — ABNORMAL LOW (ref 135–145)
Total Bilirubin: 0.9 mg/dL (ref 0.3–1.2)
Total Protein: 6.1 g/dL — ABNORMAL LOW (ref 6.5–8.1)

## 2021-07-04 LAB — CBC WITH DIFFERENTIAL (CANCER CENTER ONLY)
Abs Immature Granulocytes: 0.02 10*3/uL (ref 0.00–0.07)
Basophils Absolute: 0 10*3/uL (ref 0.0–0.1)
Basophils Relative: 0 %
Eosinophils Absolute: 0.1 10*3/uL (ref 0.0–0.5)
Eosinophils Relative: 0 %
HCT: 39.3 % (ref 36.0–46.0)
Hemoglobin: 13.4 g/dL (ref 12.0–15.0)
Immature Granulocytes: 0 %
Lymphocytes Relative: 82 %
Lymphs Abs: 14.1 10*3/uL — ABNORMAL HIGH (ref 0.7–4.0)
MCH: 33.6 pg (ref 26.0–34.0)
MCHC: 34.1 g/dL (ref 30.0–36.0)
MCV: 98.5 fL (ref 80.0–100.0)
Monocytes Absolute: 1.1 10*3/uL — ABNORMAL HIGH (ref 0.1–1.0)
Monocytes Relative: 6 %
Neutro Abs: 2 10*3/uL (ref 1.7–7.7)
Neutrophils Relative %: 12 %
Platelet Count: 99 10*3/uL — ABNORMAL LOW (ref 150–400)
RBC: 3.99 MIL/uL (ref 3.87–5.11)
RDW: 13.3 % (ref 11.5–15.5)
WBC Count: 17.3 10*3/uL — ABNORMAL HIGH (ref 4.0–10.5)
nRBC: 0 % (ref 0.0–0.2)

## 2021-07-04 LAB — SAVE SMEAR(SSMR), FOR PROVIDER SLIDE REVIEW

## 2021-07-04 LAB — LACTATE DEHYDROGENASE: LDH: 195 U/L — ABNORMAL HIGH (ref 98–192)

## 2021-07-04 NOTE — Progress Notes (Signed)
Hematology and Oncology Follow Up Visit  Heather Knapp [id number] Jun 29, 1934 87 y.o. 07/04/2021   Principle Diagnosis:  Stage a CLL Stage I (T1N0M0) infiltrating ductal carcinoma of the left breast   Current Therapy:        Observation   Interim History:  Heather Knapp is here today with her [name] for follow-up.  Overall, I must say that I am impressed with how well she is doing.  She may have some memory issues but overall, I really do not think that she is all that impaired.  It is fun talking to her.  She does try to stay active.  She has been eating okay.  There is no issues with nausea or vomiting.  She has had no problems with bowels or bladder.  She has had no fever.  She has avoided the coronavirus.  Overall, I would say performance status is probably ECOG 2.    Medications:  Allergies as of 07/04/2021   No Known Allergies      Medication List        Accurate as of July 04, 2021  2:33 PM. If you have any questions, ask your nurse or doctor.          aspirin EC 81 MG tablet Take 1 tablet (81 mg total) by mouth daily. Swallow whole.   FISH OIL PO Take 1,200 mg by mouth daily.   losartan 25 MG tablet Commonly known as: COZAAR TAKE 1 TABLET BY MOUTH EVERY DAY   memantine 10 MG tablet Commonly known as: NAMENDA Take 1 tablet (10 mg total) by mouth 2 (two) times daily. Please call [phone] to scheduled follow up for continued refills.   metoprolol succinate 50 MG 24 hr tablet Commonly known as: TOPROL-XL Take 50 mg tablet by mouth At noon  and bedtime   multivitamin with minerals tablet Take 1 tablet by mouth daily.        Allergies: No Known Allergies  Past Medical History, Surgical history, Social history, and Family History were reviewed and updated.  Review of Systems: All other 10 point review of systems is negative.   Physical Exam:  weight is 148 lb (67.1 kg). Her oral temperature is 98.2 F (36.8 C). Her blood pressure is  147/62 (abnormal) and her pulse is 78. Her respiration is 18 and oxygen saturation is 99%.   Wt Readings from Last 3 Encounters:  07/04/21 148 lb (67.1 kg)  05/31/21 147 lb (66.7 kg)  05/14/21 147 lb (66.7 kg)    Ocular: Sclerae unicteric, pupils equal, round and reactive to light Ear-nose-throat: Oropharynx clear, dentition fair Lymphatic: No cervical or supraclavicular adenopathy Lungs no rales or rhonchi, good excursion bilaterally Heart regular rate and rhythm, no murmur appreciated Abd soft, nontender, positive bowel sounds, no liver or spleen tip palpated on exam, no fluid wave  MSK no focal spinal tenderness, no joint edema Neuro: non-focal, well-oriented, appropriate affect Breasts: Deferred   Lab Results  Component Value Date   WBC 17.3 (H) 07/04/2021   HGB 13.4 07/04/2021   HCT 39.3 07/04/2021   MCV 98.5 07/04/2021   PLT 99 (L) 07/04/2021   No results found for: FERRITIN, IRON, TIBC, UIBC, IRONPCTSAT Lab Results  Component Value Date   RETICCTPCT 2.0 06/25/2018   RBC 3.99 07/04/2021   RETICCTABS 65.0 10/23/2011   RETICCTABS 65.0 10/23/2011   RETICCTABS 65.0 10/23/2011   RETICCTABS 65.0 10/23/2011   No results found for: KPAFRELGTCHN, LAMBDASER, KAPLAMBRATIO Lab Results  Component Value  Date   [id number] (L) 08/25/2014   IGA 21 (L) 08/25/2014   IGMSERUM 6 (L) 08/25/2014   Lab Results  Component Value Date   TOTALPROTELP 6.1 07/28/2011   ALBUMINELP 68.5 (H) 07/28/2011   A1GS 5.6 (H) 07/28/2011   A2GS 9.6 07/28/2011   BETS 5.8 07/28/2011   BETA2SER 3.5 07/28/2011   GAMS 7.0 (L) 07/28/2011   MSPIKE NOT DET 07/28/2011   SPEI * 07/28/2011     Chemistry      Component Value Date/Time   NA 131 (L) 07/04/2021 1349   NA 138 02/29/2016 1112   K 4.2 07/04/2021 1349   K 4.4 02/29/2016 1112   CL 97 (L) 07/04/2021 1349   CL 98 10/28/2013 0949   CO2 28 07/04/2021 1349   CO2 25 02/29/2016 1112   BUN 15 07/04/2021 1349   BUN 16.6 02/29/2016 1112    CREATININE 0.78 07/04/2021 1349   CREATININE 0.7 02/29/2016 1112      Component Value Date/Time   CALCIUM 9.2 07/04/2021 1349   CALCIUM 9.2 02/29/2016 1112   ALKPHOS 36 (L) 07/04/2021 1349   ALKPHOS 61 02/29/2016 1112   AST 22 07/04/2021 1349   AST 17 02/29/2016 1112   ALT 24 07/04/2021 1349   ALT 12 02/29/2016 1112   BILITOT 0.9 07/04/2021 1349   BILITOT 0.72 02/29/2016 1112       Impression and Plan: Heather Knapp is a very pleasant 87 yo caucasian female with CLL. She looks great and continues to do well.   In reality, nothing really has changed over the past year and a half.  Her blood counts are about the same.  I just think that she is going to live with his CLL.  I just doubt that she will ever have a problem with it.  I am just happy that her quality of life is doing well.  I suspect that her dementia will probably dictate her prognosis.  We will get her back in 1 year.   Heather Napoleon, MD 7/28/20222:33 PM

## 2021-07-08 ENCOUNTER — Telehealth: Payer: Self-pay | Admitting: *Deleted

## 2021-07-08 NOTE — Telephone Encounter (Signed)
Per 07/04/21 los called and gave upcoming appointments - confirmed

## 2021-09-02 DIAGNOSIS — F0390 Unspecified dementia without behavioral disturbance: Secondary | ICD-10-CM | POA: Diagnosis not present

## 2021-09-02 DIAGNOSIS — I739 Peripheral vascular disease, unspecified: Secondary | ICD-10-CM | POA: Diagnosis not present

## 2021-09-02 DIAGNOSIS — E663 Overweight: Secondary | ICD-10-CM | POA: Diagnosis not present

## 2021-09-02 DIAGNOSIS — M810 Age-related osteoporosis without current pathological fracture: Secondary | ICD-10-CM | POA: Diagnosis not present

## 2021-09-02 DIAGNOSIS — Z820 Family history of epilepsy and other diseases of the nervous system: Secondary | ICD-10-CM | POA: Diagnosis not present

## 2021-09-02 DIAGNOSIS — I10 Essential (primary) hypertension: Secondary | ICD-10-CM | POA: Diagnosis not present

## 2021-09-02 DIAGNOSIS — Z7982 Long term (current) use of aspirin: Secondary | ICD-10-CM | POA: Diagnosis not present

## 2021-09-02 DIAGNOSIS — E785 Hyperlipidemia, unspecified: Secondary | ICD-10-CM | POA: Diagnosis not present

## 2021-09-02 DIAGNOSIS — Z008 Encounter for other general examination: Secondary | ICD-10-CM | POA: Diagnosis not present

## 2021-09-02 DIAGNOSIS — C9511 Chronic leukemia of unspecified cell type, in remission: Secondary | ICD-10-CM | POA: Diagnosis not present

## 2021-09-02 DIAGNOSIS — R69 Illness, unspecified: Secondary | ICD-10-CM | POA: Diagnosis not present

## 2021-09-02 DIAGNOSIS — Z6825 Body mass index (BMI) 25.0-25.9, adult: Secondary | ICD-10-CM | POA: Diagnosis not present

## 2021-09-20 ENCOUNTER — Other Ambulatory Visit: Payer: Self-pay

## 2021-09-20 ENCOUNTER — Ambulatory Visit (HOSPITAL_COMMUNITY): Payer: Medicare HMO | Attending: Internal Medicine

## 2021-09-20 DIAGNOSIS — I341 Nonrheumatic mitral (valve) prolapse: Secondary | ICD-10-CM

## 2021-09-20 DIAGNOSIS — I340 Nonrheumatic mitral (valve) insufficiency: Secondary | ICD-10-CM

## 2021-09-20 DIAGNOSIS — R011 Cardiac murmur, unspecified: Secondary | ICD-10-CM

## 2021-09-20 LAB — ECHOCARDIOGRAM COMPLETE
AR max vel: 2.66 cm2
AV Area VTI: 3.01 cm2
AV Area mean vel: 2.58 cm2
AV Mean grad: 6 mmHg
AV Peak grad: 11.3 mmHg
Ao pk vel: 1.68 m/s
Area-P 1/2: 3.42 cm2
MV M vel: 4.73 m/s
MV Peak grad: 89.3 mmHg
MV VTI: 2.02 cm2
Radius: 0.4 cm
S' Lateral: 2.9 cm

## 2021-11-22 ENCOUNTER — Telehealth (INDEPENDENT_AMBULATORY_CARE_PROVIDER_SITE_OTHER): Payer: Medicare HMO | Admitting: Cardiology

## 2021-11-22 ENCOUNTER — Telehealth: Payer: Self-pay | Admitting: *Deleted

## 2021-11-22 ENCOUNTER — Encounter: Payer: Self-pay | Admitting: Cardiology

## 2021-11-22 ENCOUNTER — Telehealth: Payer: Medicare HMO | Admitting: Cardiology

## 2021-11-22 ENCOUNTER — Other Ambulatory Visit: Payer: Self-pay

## 2021-11-22 DIAGNOSIS — R011 Cardiac murmur, unspecified: Secondary | ICD-10-CM | POA: Diagnosis not present

## 2021-11-22 DIAGNOSIS — I340 Nonrheumatic mitral (valve) insufficiency: Secondary | ICD-10-CM

## 2021-11-22 DIAGNOSIS — I10 Essential (primary) hypertension: Secondary | ICD-10-CM | POA: Diagnosis not present

## 2021-11-22 DIAGNOSIS — I5032 Chronic diastolic (congestive) heart failure: Secondary | ICD-10-CM

## 2021-11-22 DIAGNOSIS — I341 Nonrheumatic mitral (valve) prolapse: Secondary | ICD-10-CM

## 2021-11-22 NOTE — Assessment & Plan Note (Signed)
Stable follow-up echocardiogram.  Continues to be moderate MR with MVP.  Totally asymptomatic.  At this point, given her advanced age plan will be to reassess her symptoms next year, and if stable to wait another year to recheck echo.

## 2021-11-22 NOTE — Patient Instructions (Addendum)
Medication Instructions:  No change  *If you need a refill on your cardiac medications before your next appointment, please call your pharmacy*   Lab Work: Per PCP     Testing/Procedures: None for now    Follow-Up: At [facility] you and your health needs are our priority.  As part of our continuing mission to provide you with exceptional heart care, we have created designated Provider Care Teams.  These Care Teams include your primary Cardiologist (physician) and Advanced Practice Providers (APPs -  Physician Assistants and Nurse Practitioners) who all work together to provide you with the care you need, when you need it.    Your next appointment:   1 year(s)  The format for your next appointment:   In Person  Provider:   Glenetta Hew, MD     Other Instructions HAPPY HOLIDAYS !!!  [place]!!!

## 2021-11-22 NOTE — Progress Notes (Signed)
See 2nd visit for the day.  Glenetta Hew, MD
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
## 2021-11-22 NOTE — Assessment & Plan Note (Signed)
She was euvolemic back in June.  Has no issues with PND, orthopnea or edema.  Nothing to suggest volume overload.  Last visit, we had her split her Toprol into twice daily dosing, I think this is probably one of the reasons why her fatigue is improved.

## 2021-11-22 NOTE — Assessment & Plan Note (Signed)
MVP noted on echo, but not severe.  Associated with moderate MR.  Asymptomatic.  Plan to reassess symptoms in a year and likely recheck echo in 2 years.Marland Kitchen

## 2021-11-22 NOTE — Telephone Encounter (Signed)
°  Patient Consent for Virtual Visit        Heather Knapp has provided verbal consent on 11/22/2021 for a virtual visit (video or telephone).   CONSENT FOR VIRTUAL VISIT FOR:  Heather Knapp  By participating in this virtual visit I agree to the following:  I hereby voluntarily request, consent and authorize [facility] and its employed or contracted physicians, physician assistants, nurse practitioners or other licensed health care professionals (the Practitioner), to provide me with telemedicine health care services (the Services") as deemed necessary by the treating Practitioner. I acknowledge and consent to receive the Services by the Practitioner via telemedicine. I understand that the telemedicine visit will involve communicating with the Practitioner through live audiovisual communication technology and the disclosure of certain medical information by electronic transmission. I acknowledge that I have been given the opportunity to request an in-person assessment or other available alternative prior to the telemedicine visit and am voluntarily participating in the telemedicine visit.  I understand that I have the right to withhold or withdraw my consent to the use of telemedicine in the course of my care at any time, without affecting my right to future care or treatment, and that the Practitioner or I may terminate the telemedicine visit at any time. I understand that I have the right to inspect all information obtained and/or recorded in the course of the telemedicine visit and may receive copies of available information for a reasonable fee.  I understand that some of the potential risks of receiving the Services via telemedicine include:  Delay or interruption in medical evaluation due to technological equipment failure or disruption; Information transmitted may not be sufficient (e.g. poor resolution of images) to allow for appropriate medical decision making by the Practitioner;  and/or  In rare instances, security protocols could fail, causing a breach of personal health information.  Furthermore, I acknowledge that it is my responsibility to provide information about my medical history, conditions and care that is complete and accurate to the best of my ability. I acknowledge that Practitioner's advice, recommendations, and/or decision may be based on factors not within their control, such as incomplete or inaccurate data provided by me or distortions of diagnostic images or specimens that may result from electronic transmissions. I understand that the practice of medicine is not an exact science and that Practitioner makes no warranties or guarantees regarding treatment outcomes. I acknowledge that a copy of this consent can be made available to me via my patient portal ([facility]), or I can request a printed copy by calling the office of [facility].    I understand that my insurance will be billed for this visit.   I have read or had this consent read to me. I understand the contents of this consent, which adequately explains the benefits and risks of the Services being provided via telemedicine.  I have been provided ample opportunity to ask questions regarding this consent and the Services and have had my questions answered to my satisfaction. I give my informed consent for the services to be provided through the use of telemedicine in my medical care

## 2021-11-22 NOTE — Telephone Encounter (Signed)
RN spoke to patient's daughter [name] . Instruction were given  from today's virtual visit 12.16.22 .  AVS SUMMARY has been sent by mychart .   Patient 's daughter [name]  verbalized understanding

## 2021-11-22 NOTE — Assessment & Plan Note (Signed)
On stable dose of losartan and Toprol.  Since you are tolerating relatively well.  Cannot determine if appropriately managed since we did not have vital signs today.

## 2021-11-22 NOTE — Assessment & Plan Note (Signed)
Aortic sclerosis.

## 2021-11-26 ENCOUNTER — Encounter: Payer: Self-pay | Admitting: Neurology

## 2021-11-26 ENCOUNTER — Ambulatory Visit: Payer: Medicare HMO | Admitting: Neurology

## 2021-11-26 VITALS — BP 143/66 | HR 68 | Ht 62.0 in | Wt 147.5 lb

## 2021-11-26 DIAGNOSIS — E7800 Pure hypercholesterolemia, unspecified: Secondary | ICD-10-CM | POA: Diagnosis not present

## 2021-11-26 DIAGNOSIS — F0390 Unspecified dementia without behavioral disturbance: Secondary | ICD-10-CM | POA: Diagnosis not present

## 2021-11-26 DIAGNOSIS — I5032 Chronic diastolic (congestive) heart failure: Secondary | ICD-10-CM | POA: Diagnosis not present

## 2021-11-26 DIAGNOSIS — C9110 Chronic lymphocytic leukemia of B-cell type not having achieved remission: Secondary | ICD-10-CM | POA: Diagnosis not present

## 2021-11-26 DIAGNOSIS — R69 Illness, unspecified: Secondary | ICD-10-CM | POA: Diagnosis not present

## 2021-11-26 DIAGNOSIS — R269 Unspecified abnormalities of gait and mobility: Secondary | ICD-10-CM | POA: Diagnosis not present

## 2021-11-26 DIAGNOSIS — I10 Essential (primary) hypertension: Secondary | ICD-10-CM | POA: Diagnosis not present

## 2021-11-26 MED ORDER — MEMANTINE HCL 10 MG PO TABS: 10.0000 mg | ORAL_TABLET | Freq: Two times a day (BID) | ORAL | 4 refills | Status: DC

## 2021-11-26 NOTE — Patient Instructions (Signed)
Great to see you today! Continue the Nam[place] Continue to be safe at home  See you back 8-12 months
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
## 2021-12-06 DIAGNOSIS — M533 Sacrococcygeal disorders, not elsewhere classified: Secondary | ICD-10-CM | POA: Diagnosis not present

## 2022-02-03 ENCOUNTER — Other Ambulatory Visit: Payer: Self-pay

## 2022-02-03 ENCOUNTER — Ambulatory Visit: Payer: Medicare HMO | Admitting: Podiatry

## 2022-02-03 DIAGNOSIS — M2042 Other hammer toe(s) (acquired), left foot: Secondary | ICD-10-CM

## 2022-02-03 DIAGNOSIS — M21612 Bunion of left foot: Secondary | ICD-10-CM

## 2022-02-03 DIAGNOSIS — M2041 Other hammer toe(s) (acquired), right foot: Secondary | ICD-10-CM | POA: Diagnosis not present

## 2022-02-03 DIAGNOSIS — M2012 Hallux valgus (acquired), left foot: Secondary | ICD-10-CM

## 2022-02-03 DIAGNOSIS — M79675 Pain in left toe(s): Secondary | ICD-10-CM | POA: Diagnosis not present

## 2022-02-03 DIAGNOSIS — M79674 Pain in right toe(s): Secondary | ICD-10-CM

## 2022-02-03 DIAGNOSIS — M2011 Hallux valgus (acquired), right foot: Secondary | ICD-10-CM | POA: Diagnosis not present

## 2022-02-03 DIAGNOSIS — B351 Tinea unguium: Secondary | ICD-10-CM

## 2022-02-03 DIAGNOSIS — M21611 Bunion of right foot: Secondary | ICD-10-CM | POA: Diagnosis not present

## 2022-02-04 ENCOUNTER — Encounter: Payer: Self-pay | Admitting: Podiatry

## 2022-02-04 NOTE — Progress Notes (Signed)
°  Subjective:  Patient ID: Heather Knapp, female    DOB: 07/14/1934,  MRN: [id number]  Chief Complaint  Patient presents with   Bunions   Hammer Toe   Nail Problem    88 y.o. female presents with the above complaint. History confirmed with patient.  She returns today for follow-up from her left second hammertoe surgery.  The toe is still deformed and does not point straight.  It seems to want to crossover the big toe.  The nails are thickened elongated and painful again  Objective:  Physical Exam: warm, good capillary refill, no trophic changes or ulcerative lesions, normal DP and PT pulses, normal sensory exam, and bilaterally she has hammertoe deformities that are semirigid in nature.  She has hallux valgus deformity with a second toe crossing over, left worse than right Left Foot: dystrophic yellowed discolored nail plates with subungual debris Right Foot: dystrophic yellowed discolored nail plates with subungual debris  Assessment:   1. Hammertoe of left foot   2. Hallux valgus with bunions, right   3. Hallux valgus with bunions, left   4. Hammertoe of right foot   5. Pain due to onychomycosis of toenails of both feet      Plan:  Patient was evaluated and treated and all questions answered.  Discussed the etiology and treatment options for the condition in detail with the patient. Educated patient on the topical and oral treatment options for mycotic nails. Recommended debridement of the nails today. Sharp and mechanical debridement performed of all painful and mycotic nails today. Nails debrided in length and thickness using a nail nipper to level of comfort. Discussed treatment options including appropriate shoe gear. Follow up as needed for painful nails.  We again discussed her severe hammertoe deformity on both feet.  Unfortunately has residual deformity after surgery.  I do not think there is a simple way to fix the deformity without correcting the bunion.  We did discuss  the option of an elective toe amputation if this worsens or does not improve.  For now I recommend continued nonoperative treatment with offloading pads such as silicone toe spreaders which they will look at getting on [place].  She will return as needed if this does not improve or worsens.   No follow-ups on file.

## 2022-02-14 ENCOUNTER — Other Ambulatory Visit: Payer: Self-pay | Admitting: Cardiology

## 2022-04-23 DIAGNOSIS — I10 Essential (primary) hypertension: Secondary | ICD-10-CM | POA: Diagnosis not present

## 2022-05-15 ENCOUNTER — Telehealth: Payer: Self-pay | Admitting: Neurology

## 2022-05-15 NOTE — Telephone Encounter (Signed)
LVM and sent mychart msg informing pt of r/s needed for 8/8 appt- Heather Knapp out.

## 2022-05-25 ENCOUNTER — Other Ambulatory Visit: Payer: Self-pay

## 2022-05-25 ENCOUNTER — Emergency Department (HOSPITAL_COMMUNITY): Payer: Medicare HMO

## 2022-05-25 ENCOUNTER — Emergency Department (HOSPITAL_COMMUNITY)
Admission: EM | Admit: 2022-05-25 | Discharge: 2022-05-25 | Disposition: A | Discharge status: Home / Self Care | Payer: Medicare HMO | Attending: Emergency Medicine | Admitting: Emergency Medicine

## 2022-05-25 DIAGNOSIS — R079 Chest pain, unspecified: Secondary | ICD-10-CM | POA: Diagnosis not present

## 2022-05-25 DIAGNOSIS — Z79899 Other long term (current) drug therapy: Secondary | ICD-10-CM | POA: Insufficient documentation

## 2022-05-25 DIAGNOSIS — Z043 Encounter for examination and observation following other accident: Secondary | ICD-10-CM | POA: Diagnosis not present

## 2022-05-25 DIAGNOSIS — S2241XA Multiple fractures of ribs, right side, initial encounter for closed fracture: Secondary | ICD-10-CM | POA: Diagnosis not present

## 2022-05-25 DIAGNOSIS — J984 Other disorders of lung: Secondary | ICD-10-CM | POA: Diagnosis not present

## 2022-05-25 DIAGNOSIS — Y92003 Bedroom of unspecified non-institutional (private) residence as the place of occurrence of the external cause: Secondary | ICD-10-CM | POA: Insufficient documentation

## 2022-05-25 DIAGNOSIS — W01198A Fall on same level from slipping, tripping and stumbling with subsequent striking against other object, initial encounter: Secondary | ICD-10-CM | POA: Diagnosis not present

## 2022-05-25 DIAGNOSIS — Z856 Personal history of leukemia: Secondary | ICD-10-CM | POA: Diagnosis not present

## 2022-05-25 DIAGNOSIS — R55 Syncope and collapse: Secondary | ICD-10-CM | POA: Diagnosis not present

## 2022-05-25 DIAGNOSIS — I10 Essential (primary) hypertension: Secondary | ICD-10-CM | POA: Diagnosis not present

## 2022-05-25 DIAGNOSIS — J811 Chronic pulmonary edema: Secondary | ICD-10-CM | POA: Diagnosis not present

## 2022-05-25 DIAGNOSIS — N3001 Acute cystitis with hematuria: Secondary | ICD-10-CM | POA: Insufficient documentation

## 2022-05-25 DIAGNOSIS — W19XXXA Unspecified fall, initial encounter: Secondary | ICD-10-CM

## 2022-05-25 DIAGNOSIS — Z7982 Long term (current) use of aspirin: Secondary | ICD-10-CM | POA: Diagnosis not present

## 2022-05-25 DIAGNOSIS — R0601 Orthopnea: Secondary | ICD-10-CM | POA: Diagnosis not present

## 2022-05-25 LAB — BASIC METABOLIC PANEL
Anion gap: 11 (ref 5–15)
BUN: 13 mg/dL (ref 8–23)
CO2: 20 mmol/L — ABNORMAL LOW (ref 22–32)
Calcium: 9 mg/dL (ref 8.9–10.3)
Chloride: 98 mmol/L (ref 98–111)
Creatinine, Ser: 0.66 mg/dL (ref 0.44–1.00)
GFR, Estimated: >60 mL/min (ref >60)
Glucose, Bld: 140 mg/dL — ABNORMAL HIGH (ref 70–99)
Potassium: 4.5 mmol/L (ref 3.5–5.1)
Sodium: 129 mmol/L — ABNORMAL LOW (ref 135–145)

## 2022-05-25 LAB — URINALYSIS, ROUTINE W REFLEX MICROSCOPIC
Bilirubin Urine: NEGATIVE
Glucose, UA: NEGATIVE mg/dL
Ketones, ur: 5 mg/dL — AB
Nitrite: NEGATIVE
Protein, ur: NEGATIVE mg/dL
Specific Gravity, Urine: 1.01 (ref 1.005–1.030)
pH: 6 (ref 5.0–8.0)

## 2022-05-25 LAB — CBC WITH DIFFERENTIAL/PLATELET
Abs Immature Granulocytes: 0.09 10*3/uL — ABNORMAL HIGH (ref 0.00–0.07)
Basophils Absolute: 0.1 10*3/uL (ref 0.0–0.1)
Basophils Relative: 0 %
Eosinophils Absolute: 0 10*3/uL (ref 0.0–0.5)
Eosinophils Relative: 0 %
HCT: 37.1 % (ref 36.0–46.0)
Hemoglobin: 12.2 g/dL (ref 12.0–15.0)
Immature Granulocytes: 0 %
Lymphocytes Relative: 73 %
Lymphs Abs: 21.1 10*3/uL — ABNORMAL HIGH (ref 0.7–4.0)
MCH: 31.4 pg (ref 26.0–34.0)
MCHC: 32.9 g/dL (ref 30.0–36.0)
MCV: 95.4 fL (ref 80.0–100.0)
Monocytes Absolute: 0.4 10*3/uL (ref 0.1–1.0)
Monocytes Relative: 1 %
Neutro Abs: 7.4 10*3/uL (ref 1.7–7.7)
Neutrophils Relative %: 26 %
Platelets: 167 10*3/uL (ref 150–400)
RBC: 3.89 MIL/uL (ref 3.87–5.11)
RDW: 13.3 % (ref 11.5–15.5)
WBC Morphology: ABNORMAL
WBC: 29.1 10*3/uL — ABNORMAL HIGH (ref 4.0–10.5)
nRBC: 0 % (ref 0.0–0.2)

## 2022-05-25 LAB — CBG MONITORING, ED: Glucose-Capillary: 146 mg/dL — ABNORMAL HIGH (ref 70–99)

## 2022-05-25 LAB — CK: Total CK: 339 U/L — ABNORMAL HIGH (ref 38–234)

## 2022-05-25 MED ORDER — SULFAMETHOXAZOLE-TRIMETHOPRIM 800-160 MG PO TABS: 1.0000 | ORAL_TABLET | Freq: Two times a day (BID) | ORAL | 0 refills | Status: AC

## 2022-05-25 NOTE — ED Provider Notes (Signed)
Heather Knapp DEPT Provider Note   CSN: [id number] Arrival date & time: 05/25/22  1404     History {Add pertinent medical, surgical, social history, OB history to HPI:1} Chief Complaint  Patient presents with   Lytle Michaels    Heather Knapp is a 88 y.o. female.  Patient with a history of CLL and hypertension.  She fell and hit the right side of her chest and hit her head.  No loss of consciousness.   Fall       Home Medications Prior to Admission medications   Medication Sig Start Date End Date Taking? Authorizing Provider  acetaminophen (TYLENOL) 650 MG CR tablet Take 650-1,300 mg by mouth daily as needed for pain.   Yes [provider]  Ascorbic Acid (VITAMIN C PO) Take 1 tablet by mouth daily with lunch.   Yes [provider]  aspirin EC 81 MG tablet Take 81 mg by mouth daily with lunch. Swallow whole. 11/23/20  Yes Leonie Man, MD  Calcium Carbonate-Vitamin D (CALCIUM-D PO) Take 1 tablet by mouth daily with lunch.   Yes [provider]  losartan (COZAAR) 25 MG tablet TAKE 1 TABLET BY MOUTH EVERY DAY Patient taking differently: Take 25 mg by mouth daily with lunch. 02/14/22  Yes Leonie Man, MD  memantine (NAMENDA) 10 MG tablet Take 1 tablet (10 mg total) by mouth 2 (two) times daily. Patient taking differently: Take 10 mg by mouth See admin instructions. Take one tablet (10 mg) by mouth twice daily - noon and bedtime 11/26/21  Yes Suzzanne Cloud, NP  metoprolol succinate (TOPROL-XL) 50 MG 24 hr tablet Take 50 mg tablet by mouth At noon  and bedtime Patient taking differently: Take 100 mg by mouth daily with lunch. 05/31/21  Yes Leonie Man, MD  Omega-3 Fatty Acids (FISH OIL PO) Take 1 capsule by mouth daily with lunch.   Yes [provider]  POTASSIUM PO Take 1 tablet by mouth daily with lunch.   Yes [provider]  Probiotic Product (PROBIOTIC PO) Take 1 tablet by mouth daily with lunch.    Yes [provider]  sulfamethoxazole-trimethoprim (BACTRIM DS) 800-160 MG tablet Take 1 tablet by mouth 2 (two) times daily for 7 days. 05/25/22 06/01/22 Yes Milton Ferguson, MD  TURMERIC PO Take 1 capsule by mouth daily with lunch.   Yes [provider]      Allergies    Patient has no known allergies.    Review of Systems   Review of Systems  Physical Exam Updated Vital Signs BP (!) 140/53 (BP Location: Left Arm)   Pulse 86   Temp 97.9 F (36.6 C) (Oral)   Resp 16   Ht '5\' 2"'$  (1.575 m)   Wt 70 kg   SpO2 99%   BMI 28.23 kg/m  Physical Exam  ED Results / Procedures / Treatments   Labs (all labs ordered are listed, but only abnormal results are displayed) Labs Reviewed  BASIC METABOLIC PANEL - Abnormal; Notable for the following components:      Result Value   Sodium 129 (*)    CO2 20 (*)    Glucose, Bld 140 (*)    All other components within normal limits  CBC WITH DIFFERENTIAL/PLATELET - Abnormal; Notable for the following components:   WBC 29.1 (*)    Lymphs Abs 21.1 (*)    Abs Immature Granulocytes 0.09 (*)    All other components within normal limits  CK -  Abnormal; Notable for the following components:   Total CK 339 (*)    All other components within normal limits  URINALYSIS, ROUTINE W REFLEX MICROSCOPIC - Abnormal; Notable for the following components:   Color, Urine AMBER (*)    APPearance CLOUDY (*)    Hgb urine dipstick MODERATE (*)    Ketones, ur 5 (*)    Leukocytes,Ua MODERATE (*)    Bacteria, UA RARE (*)    All other components within normal limits  URINE CULTURE  PATHOLOGIST SMEAR REVIEW  CBG MONITORING, ED    EKG None  Radiology DG Hip Unilat With Pelvis 2-3 Views Right  Result Date: 05/25/2022 CLINICAL DATA:  Fall, difficult weight-bearing EXAM: DG HIP (WITH OR WITHOUT PELVIS) 2-3V RIGHT COMPARISON:  CT abdomen and pelvis 07/13/2015 FINDINGS: No acute fracture or dislocation identified in the right hip. Joint space is  preserved. Old fracture deformities of the left pubic rami. IMPRESSION: No acute osseous abnormality identified. Electronically Signed   By: Ofilia Neas M.D.   On: 05/25/2022 15:42   DG Femur Min 2 Views Right  Result Date: 05/25/2022 CLINICAL DATA:  Syncope with orthopnea EXAM: RIGHT FEMUR 2 VIEWS COMPARISON:  X-ray pelvis and right hip 05/25/2022 FINDINGS: There is no evidence of fracture or other focal bone lesions. Soft tissues are unremarkable. IMPRESSION: Negative. Electronically Signed   By: Iven Finn M.D.   On: 05/25/2022 15:39   CT Head Wo Contrast  Result Date: 05/25/2022 CLINICAL DATA:  Syncopal episode.  Fall. EXAM: CT HEAD WITHOUT CONTRAST TECHNIQUE: Contiguous axial images were obtained from the base of the skull through the vertex without intravenous contrast. RADIATION DOSE REDUCTION: This exam was performed according to the departmental dose-optimization program which includes automated exposure control, adjustment of the mA and/or kV according to patient size and/or use of iterative reconstruction technique. COMPARISON:  01/02/2007 FINDINGS: Brain: No evidence of intracranial hemorrhage, acute infarction, hydrocephalus, extra-axial collection, or mass lesion/mass effect. Mild diffuse cerebral atrophy and moderate chronic small vessel disease is seen, which shows progression since 2008. Vascular:  No hyperdense vessel or other acute findings. Skull: No evidence of fracture or other significant bone abnormality. Sinuses/Orbits:  No acute findings. Other: None. IMPRESSION: No acute intracranial abnormality. Cerebral atrophy and chronic small vessel disease. Electronically Signed   By: Marlaine Hind M.D.   On: 05/25/2022 15:29   DG Ribs Unilateral W/Chest Right  Result Date: 05/25/2022 CLINICAL DATA:  rib cage pain, posterior EXAM: RIGHT RIBS AND CHEST - 3+ VIEW COMPARISON:  Chest x-ray 03/06/2020 FINDINGS: Limited evaluation due to overlapping osseous structures and overlying  soft tissues. BB marker overlies the lower right chest. Old healed right rib fractures. No acute displaced fracture or other bone lesions are seen involving the ribs. The heart and mediastinal contours are unchanged. Marked mitral annular calcification. Biapical pleural/pulmonary scarring. No focal consolidation. Chronic coarsened interstitial markings. Pulmonary edema. No pleural effusion. No pneumothorax. IMPRESSION: 1. No acute displaced right rib fracture. Please note, nondisplaced rib fractures may be occult on radiograph. Limited evaluation due to overlapping osseous structures and overlying soft tissues. 2. No acute cardiopulmonary abnormality. Electronically Signed   By: Iven Finn M.D.   On: 05/25/2022 15:02    Procedures Procedures  {Document cardiac monitor, telemetry assessment procedure when appropriate:1}  Medications Ordered in ED Medications - No data to display  ED Course/ Medical Decision Making/ A&P  Medical Decision Making Amount and/or Complexity of Data Reviewed Labs: ordered. Radiology: ordered.  Risk Prescription drug management.   Patient with contusion to right chest and UTI.  She is placed on Bactrim and will take Tylenol for pain and her doctor will follow up with her this week  {Document critical care time when appropriate:1} {Document review of labs and clinical decision tools ie heart score, Chads2Vasc2 etc:1}  {Document your independent review of radiology images, and any outside records:1} {Document your discussion with family members, caretakers, and with consultants:1} {Document social determinants of health affecting pt's care:1} {Document your decision making why or why not admission, treatments were needed:1} Final Clinical Impression(s) / ED Diagnoses Final diagnoses:  Fall, initial encounter  Acute cystitis with hematuria    Rx / DC Orders ED Discharge Orders          Ordered    sulfamethoxazole-trimethoprim  (BACTRIM DS) 800-160 MG tablet  2 times daily        05/25/22 1723

## 2022-05-25 NOTE — ED Triage Notes (Signed)
BIB daughter, patient had fell in her bedroom was going to the bathroom, daughter found her on the floor, reports right sided rib cage pain and pain in her ribs when she coughs. Does not use cane or walker. Pt demented, unsure why she fell. Denies head or neck pain.

## 2022-05-25 NOTE — Discharge Instructions (Addendum)
Take Tylenol for pain.  Drink plenty of fluids.  Follow-up with your family doctor next week to check your pain and urine culture

## 2022-05-25 NOTE — ED Provider Triage Note (Signed)
Emergency Medicine Provider Triage Evaluation Note  Heather Knapp , a 88 y.o. female  was evaluated in triage.  Pt with Hx of dementia.  Found on the floor this morning by family member, who is present in triage.  Last known well time yesterday evening.  Patient initially complaining of right-sided pain.  Also orthopnea/pain with clearing throat.  Does not know why she fell or how long she was down for.  No known history of A-fib or arrhythmias.  On bASA.  Hx of CLL, HTN, heart murmur, prior pelvic fracture.  Review of Systems  Positive:  Negative: See above  Physical Exam  BP (!) 140/53 (BP Location: Left Arm)   Pulse 86   Temp 97.9 F (36.6 C) (Oral)   Resp 16   Ht '5\' 2"'$  (1.575 m)   Wt 70 kg   SpO2 99%   BMI 28.23 kg/m  Gen:   Awake, no distress, PT at baseline per family member Resp:  Normal effort MSK:   Moves extremities with some  Other:  Tenderness of the right hip and right femoral head/neck.  Lower extremities appear neurovascularly intact. Chest non-ttp.  Irregularly irregular heart rate.  Audible murmur.  Medical Decision Making  Medically screening exam initiated at 2:58 PM.  Appropriate orders placed.  Heather Knapp was informed that the remainder of the evaluation will be completed by another provider, this initial triage assessment does not replace that evaluation, and the importance of remaining in the ED until their evaluation is complete.  Discussed patient with triage nurse.  Patient affected extremity bed.   Heather Rome, PA-C 09/81/19 1506

## 2022-05-27 LAB — PATHOLOGIST SMEAR REVIEW

## 2022-05-27 LAB — URINE CULTURE: Culture: 30000 — AB

## 2022-06-03 DIAGNOSIS — N399 Disorder of urinary system, unspecified: Secondary | ICD-10-CM | POA: Diagnosis not present

## 2022-06-25 ENCOUNTER — Ambulatory Visit: Payer: Medicare HMO | Admitting: Neurology

## 2022-06-25 ENCOUNTER — Encounter: Payer: Self-pay | Admitting: Neurology

## 2022-06-25 ENCOUNTER — Other Ambulatory Visit: Payer: Self-pay | Admitting: Cardiology

## 2022-06-25 VITALS — BP 137/82 | HR 94 | Ht 62.0 in | Wt 141.0 lb

## 2022-06-25 DIAGNOSIS — R69 Illness, unspecified: Secondary | ICD-10-CM | POA: Diagnosis not present

## 2022-06-25 DIAGNOSIS — F0390 Unspecified dementia without behavioral disturbance: Secondary | ICD-10-CM

## 2022-06-25 DIAGNOSIS — N390 Urinary tract infection, site not specified: Secondary | ICD-10-CM | POA: Insufficient documentation

## 2022-06-25 DIAGNOSIS — R269 Unspecified abnormalities of gait and mobility: Secondary | ICD-10-CM | POA: Diagnosis not present

## 2022-06-25 MED ORDER — MEMANTINE HCL 10 MG PO TABS
10.0000 mg | ORAL_TABLET | Freq: Two times a day (BID) | ORAL | 4 refills | Status: DC
Start: 2022-06-25 — End: 2023-06-30
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
## 2022-06-25 NOTE — Patient Instructions (Addendum)
I will order a rolling walker, physical therapy  Continue Namenda

## 2022-07-04 ENCOUNTER — Inpatient Hospital Stay: Payer: Medicare HMO | Attending: Hematology & Oncology

## 2022-07-04 ENCOUNTER — Encounter: Payer: Self-pay | Admitting: Hematology & Oncology

## 2022-07-04 ENCOUNTER — Inpatient Hospital Stay: Payer: Medicare HMO | Admitting: Hematology & Oncology

## 2022-07-04 ENCOUNTER — Other Ambulatory Visit: Payer: Self-pay

## 2022-07-04 VITALS — BP 141/59 | HR 73 | Temp 98.3°F | Resp 8 | Ht 62.0 in | Wt 140.8 lb

## 2022-07-04 DIAGNOSIS — Z79899 Other long term (current) drug therapy: Secondary | ICD-10-CM | POA: Diagnosis not present

## 2022-07-04 DIAGNOSIS — R69 Illness, unspecified: Secondary | ICD-10-CM | POA: Diagnosis not present

## 2022-07-04 DIAGNOSIS — C9110 Chronic lymphocytic leukemia of B-cell type not having achieved remission: Secondary | ICD-10-CM

## 2022-07-04 DIAGNOSIS — Z7982 Long term (current) use of aspirin: Secondary | ICD-10-CM | POA: Insufficient documentation

## 2022-07-04 DIAGNOSIS — F0390 Unspecified dementia without behavioral disturbance: Secondary | ICD-10-CM | POA: Diagnosis not present

## 2022-07-04 DIAGNOSIS — Z853 Personal history of malignant neoplasm of breast: Secondary | ICD-10-CM | POA: Diagnosis not present

## 2022-07-04 LAB — CMP (CANCER CENTER ONLY)
ALT: 12 U/L (ref 0–44)
AST: 16 U/L (ref 15–41)
Albumin: 4.7 g/dL (ref 3.5–5.0)
Alkaline Phosphatase: 55 U/L (ref 38–126)
Anion gap: 7 (ref 5–15)
BUN: 13 mg/dL (ref 8–23)
CO2: 27 mmol/L (ref 22–32)
Calcium: 9.3 mg/dL (ref 8.9–10.3)
Chloride: 101 mmol/L (ref 98–111)
Creatinine: 0.76 mg/dL (ref 0.44–1.00)
GFR, Estimated: >60 mL/min (ref >60)
Glucose, Bld: 90 mg/dL (ref 70–99)
Potassium: 4.3 mmol/L (ref 3.5–5.1)
Sodium: 135 mmol/L (ref 135–145)
Total Bilirubin: 0.5 mg/dL (ref 0.3–1.2)
Total Protein: 6.2 g/dL — ABNORMAL LOW (ref 6.5–8.1)

## 2022-07-04 LAB — CBC WITH DIFFERENTIAL (CANCER CENTER ONLY)
Abs Immature Granulocytes: 0.03 10*3/uL (ref 0.00–0.07)
Basophils Absolute: 0.1 10*3/uL (ref 0.0–0.1)
Basophils Relative: 0 %
Eosinophils Absolute: 0.1 10*3/uL (ref 0.0–0.5)
Eosinophils Relative: 0 %
HCT: 41.8 % (ref 36.0–46.0)
Hemoglobin: 13.6 g/dL (ref 12.0–15.0)
Immature Granulocytes: 0 %
Lymphocytes Relative: 81 %
Lymphs Abs: 14.6 10*3/uL — ABNORMAL HIGH (ref 0.7–4.0)
MCH: 31.6 pg (ref 26.0–34.0)
MCHC: 32.5 g/dL (ref 30.0–36.0)
MCV: 97 fL (ref 80.0–100.0)
Monocytes Absolute: 0.3 10*3/uL (ref 0.1–1.0)
Monocytes Relative: 2 %
Neutro Abs: 3 10*3/uL (ref 1.7–7.7)
Neutrophils Relative %: 17 %
Platelet Count: 118 10*3/uL — ABNORMAL LOW (ref 150–400)
RBC: 4.31 MIL/uL (ref 3.87–5.11)
RDW: 14.9 % (ref 11.5–15.5)
Smear Review: NORMAL
WBC Count: 18 10*3/uL — ABNORMAL HIGH (ref 4.0–10.5)
nRBC: 0 % (ref 0.0–0.2)

## 2022-07-04 LAB — LACTATE DEHYDROGENASE: LDH: 171 U/L (ref 98–192)

## 2022-07-04 NOTE — Progress Notes (Signed)
Hematology and Oncology Follow Up Visit  Heather Knapp [id number] 06-01-1934 88 y.o. 07/04/2022   Principle Diagnosis:  Stage a CLL Stage I (T1N0M0) infiltrating ductal carcinoma of the left breast   Current Therapy:        Observation   Interim History:  Heather Knapp is here today with her daughter for follow-up.  It has been a year since I last saw her.  She does seem to be doing pretty well.  She is still living at home.  She is still pretty much self sufficient.  She has had no problems with infections.  She has had no problems with cough or shortness of breath.  She has had no change in bowel or bladder habits.  She still has a little bit of dementia.  However, it does not look like this appears to be any worse than when we saw her last year.  She has had no swollen lymph nodes.  She has had no headache.  Overall, I would say performance status is probably ECOG 2.    Medications:  Allergies as of 07/04/2022   No Known Allergies      Medication List        Accurate as of July 04, 2022  1:52 PM. If you have any questions, ask your nurse or doctor.          acetaminophen 650 MG CR tablet Commonly known as: TYLENOL Take 650-1,300 mg by mouth daily as needed for pain.   aspirin EC 81 MG tablet Take 81 mg by mouth daily with lunch. Swallow whole.   CALCIUM-D PO Take 1 tablet by mouth daily with lunch.   FISH OIL PO Take 1 capsule by mouth daily with lunch.   losartan 25 MG tablet Commonly known as: COZAAR TAKE 1 TABLET BY MOUTH EVERY DAY What changed: when to take this   memantine 10 MG tablet Commonly known as: NAMENDA Take 1 tablet (10 mg total) by mouth 2 (two) times daily.   metoprolol succinate 50 MG 24 hr tablet Commonly known as: TOPROL-XL TAKE 1 TABLET BY MOUTH AT NOON AND AT BEDTIME--D/C '100MG'$    POTASSIUM PO Take 1 tablet by mouth daily with lunch.   PROBIOTIC PO Take 1 tablet by mouth daily with lunch.   TURMERIC PO Take 1 capsule by  mouth daily with lunch.   VITAMIN C PO Take 1 tablet by mouth daily with lunch.        Allergies: No Known Allergies  Past Medical History, Surgical history, Social history, and Family History were reviewed and updated.  Review of Systems: Review of Systems  Constitutional: Negative.   HENT: Negative.    Eyes: Negative.   Respiratory: Negative.    Cardiovascular: Negative.   Gastrointestinal: Negative.   Genitourinary: Negative.   Musculoskeletal: Negative.   Skin: Negative.   Neurological: Negative.   Endo/Heme/Allergies: Negative.   Psychiatric/Behavioral:  Positive for memory loss.      Physical Exam:  vitals were not taken for this visit.   Wt Readings from Last 3 Encounters:  06/25/22 141 lb (64 kg)  05/25/22 154 lb 5.2 oz (70 kg)  11/26/21 147 lb 8 oz (66.9 kg)   Her vital signs show temperature of 98.3.  Pulse 73.  Blood pressure 141/59.  Weight is 141 pounds.  Ocular: Sclerae unicteric, pupils equal, round and reactive to light Ear-nose-throat: Oropharynx clear, dentition fair Lymphatic: No cervical or supraclavicular adenopathy Lungs no rales or rhonchi, good excursion bilaterally Heart regular  rate and rhythm, no murmur appreciated Abd soft, nontender, positive bowel sounds, no liver or spleen tip palpated on exam, no fluid wave  MSK no focal spinal tenderness, no joint edema Neuro: non-focal, well-oriented, appropriate affect Breasts: Deferred   Lab Results  Component Value Date   WBC 18.0 (H) 07/04/2022   HGB 13.6 07/04/2022   HCT 41.8 07/04/2022   MCV 97.0 07/04/2022   PLT 118 (L) 07/04/2022   No results found for: "FERRITIN", "IRON", "TIBC", "UIBC", "IRONPCTSAT" Lab Results  Component Value Date   RETICCTPCT 2.0 06/25/2018   RBC 4.31 07/04/2022   RETICCTABS 65.0 10/23/2011   RETICCTABS 65.0 10/23/2011   RETICCTABS 65.0 10/23/2011   RETICCTABS 65.0 10/23/2011   No results found for: "KPAFRELGTCHN", "LAMBDASER", "KAPLAMBRATIO" Lab  Results  Component Value Date   IGGSERUM 367 (L) 08/25/2014   IGA 21 (L) 08/25/2014   IGMSERUM 6 (L) 08/25/2014   Lab Results  Component Value Date   TOTALPROTELP 6.1 07/28/2011   ALBUMINELP 68.5 (H) 07/28/2011   A1GS 5.6 (H) 07/28/2011   A2GS 9.6 07/28/2011   BETS 5.8 07/28/2011   BETA2SER 3.5 07/28/2011   GAMS 7.0 (L) 07/28/2011   MSPIKE NOT DET 07/28/2011   SPEI * 07/28/2011     Chemistry      Component Value Date/Time   NA 129 (L) 05/25/2022 1537   NA 138 02/29/2016 1112   K 4.5 05/25/2022 1537   K 4.4 02/29/2016 1112   CL 98 05/25/2022 1537   CL 98 10/28/2013 0949   CO2 20 (L) 05/25/2022 1537   CO2 25 02/29/2016 1112   BUN 13 05/25/2022 1537   BUN 16.6 02/29/2016 1112   CREATININE 0.66 05/25/2022 1537   CREATININE 0.78 07/04/2021 1349   CREATININE 0.7 02/29/2016 1112      Component Value Date/Time   CALCIUM 9.0 05/25/2022 1537   CALCIUM 9.2 02/29/2016 1112   ALKPHOS 36 (L) 07/04/2021 1349   ALKPHOS 61 02/29/2016 1112   AST 22 07/04/2021 1349   AST 17 02/29/2016 1112   ALT 24 07/04/2021 1349   ALT 12 02/29/2016 1112   BILITOT 0.9 07/04/2021 1349   BILITOT 0.72 02/29/2016 1112       Impression and Plan: Heather Knapp is a very pleasant 88 yo caucasian female with CLL. She looks great and continues to do well.   In reality, nothing really has changed over the past year and a half.  Her blood counts are about the same.  I am just happy that her quality of life seems to be doing pretty well right now.  It is obvious that her family is doing a great job helping to take care of her.  We will plan for another follow-up in 1 year.  If, for some reason, she did not make it back to our office, I am certainly okay with that.  Volanda Napoleon, MD 7/28/20231:52 PM

## 2022-07-15 ENCOUNTER — Ambulatory Visit: Payer: Medicare HMO | Admitting: Neurology

## 2022-08-15 ENCOUNTER — Encounter: Payer: Self-pay | Admitting: Neurology

## 2022-08-15 DIAGNOSIS — F0390 Unspecified dementia without behavioral disturbance: Secondary | ICD-10-CM

## 2022-08-15 DIAGNOSIS — R269 Unspecified abnormalities of gait and mobility: Secondary | ICD-10-CM

## 2022-08-20 DIAGNOSIS — R531 Weakness: Secondary | ICD-10-CM | POA: Diagnosis not present

## 2022-09-13 DIAGNOSIS — Z23 Encounter for immunization: Secondary | ICD-10-CM | POA: Diagnosis not present
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
## 2022-11-25 ENCOUNTER — Ambulatory Visit: Payer: Medicare HMO | Admitting: Neurology

## 2022-11-25 DIAGNOSIS — R269 Unspecified abnormalities of gait and mobility: Secondary | ICD-10-CM | POA: Diagnosis not present

## 2022-11-25 DIAGNOSIS — I10 Essential (primary) hypertension: Secondary | ICD-10-CM | POA: Diagnosis not present

## 2022-11-25 DIAGNOSIS — D696 Thrombocytopenia, unspecified: Secondary | ICD-10-CM | POA: Diagnosis not present

## 2022-11-25 DIAGNOSIS — R413 Other amnesia: Secondary | ICD-10-CM | POA: Diagnosis not present

## 2022-11-25 DIAGNOSIS — R69 Illness, unspecified: Secondary | ICD-10-CM | POA: Diagnosis not present

## 2022-11-25 DIAGNOSIS — C9110 Chronic lymphocytic leukemia of B-cell type not having achieved remission: Secondary | ICD-10-CM | POA: Diagnosis not present

## 2022-11-25 DIAGNOSIS — Z853 Personal history of malignant neoplasm of breast: Secondary | ICD-10-CM | POA: Diagnosis not present

## 2022-11-25 DIAGNOSIS — Z6826 Body mass index (BMI) 26.0-26.9, adult: Secondary | ICD-10-CM | POA: Diagnosis not present

## 2022-11-25 DIAGNOSIS — M8580 Other specified disorders of bone density and structure, unspecified site: Secondary | ICD-10-CM | POA: Diagnosis not present

## 2022-11-25 DIAGNOSIS — Z23 Encounter for immunization: Secondary | ICD-10-CM | POA: Diagnosis not present

## 2022-11-27 ENCOUNTER — Emergency Department (HOSPITAL_COMMUNITY): Payer: Medicare HMO

## 2022-11-27 ENCOUNTER — Other Ambulatory Visit: Payer: Self-pay

## 2022-11-27 ENCOUNTER — Emergency Department (HOSPITAL_COMMUNITY)
Admission: EM | Admit: 2022-11-27 | Discharge: 2022-11-27 | Disposition: A | Discharge status: Home / Self Care | Payer: Medicare HMO | Attending: Emergency Medicine | Admitting: Emergency Medicine

## 2022-11-27 DIAGNOSIS — Z7982 Long term (current) use of aspirin: Secondary | ICD-10-CM | POA: Insufficient documentation

## 2022-11-27 DIAGNOSIS — F0390 Unspecified dementia without behavioral disturbance: Secondary | ICD-10-CM | POA: Insufficient documentation

## 2022-11-27 DIAGNOSIS — S0101XA Laceration without foreign body of scalp, initial encounter: Secondary | ICD-10-CM | POA: Diagnosis not present

## 2022-11-27 DIAGNOSIS — Y92009 Unspecified place in unspecified non-institutional (private) residence as the place of occurrence of the external cause: Secondary | ICD-10-CM | POA: Insufficient documentation

## 2022-11-27 DIAGNOSIS — M47812 Spondylosis without myelopathy or radiculopathy, cervical region: Secondary | ICD-10-CM | POA: Diagnosis not present

## 2022-11-27 DIAGNOSIS — R6889 Other general symptoms and signs: Secondary | ICD-10-CM | POA: Diagnosis not present

## 2022-11-27 DIAGNOSIS — Z743 Need for continuous supervision: Secondary | ICD-10-CM | POA: Diagnosis not present

## 2022-11-27 DIAGNOSIS — Z856 Personal history of leukemia: Secondary | ICD-10-CM | POA: Insufficient documentation

## 2022-11-27 DIAGNOSIS — Z79899 Other long term (current) drug therapy: Secondary | ICD-10-CM | POA: Diagnosis not present

## 2022-11-27 DIAGNOSIS — W19XXXA Unspecified fall, initial encounter: Secondary | ICD-10-CM | POA: Diagnosis not present

## 2022-11-27 DIAGNOSIS — R69 Illness, unspecified: Secondary | ICD-10-CM | POA: Diagnosis not present

## 2022-11-27 DIAGNOSIS — S0990XA Unspecified injury of head, initial encounter: Secondary | ICD-10-CM | POA: Diagnosis not present

## 2022-11-27 LAB — URINALYSIS, ROUTINE W REFLEX MICROSCOPIC
Bilirubin Urine: NEGATIVE
Glucose, UA: NEGATIVE mg/dL
Ketones, ur: 5 mg/dL — AB
Nitrite: NEGATIVE
Protein, ur: NEGATIVE mg/dL
Specific Gravity, Urine: 1.013 (ref 1.005–1.030)
pH: 6 (ref 5.0–8.0)

## 2022-11-27 MED ORDER — TETANUS-DIPHTH-ACELL PERTUSSIS 5-2.5-18.5 LF-MCG/0.5 IM SUSY: 0.5000 mL | PREFILLED_SYRINGE | Freq: Once | INTRAMUSCULAR | Status: DC

## 2022-11-27 NOTE — ED Triage Notes (Signed)
Pt BIB EMS from home after mechanical fall around 0430. Lac to left side of head, denies blood thinners. Per EMS daughter saw pt fall on home camera. Pt hx dementia, lives alone.

## 2022-11-27 NOTE — ED Provider Notes (Signed)
[facility] DEPT Provider Note   CSN: [id number] Arrival date & time: 11/27/22  3244     History  Chief Complaint  Patient presents with   Lytle Michaels    Heather Knapp is a 88 y.o. female.  Pt is an 88y/o female with hx of CLL and dementia who lives at home alone presenting today for a fall around 4am this morning.  Daughter reports she saw her fall on camera.  Pt reports she is not sure why she fell but denies LOC.  She did hit her head.  Denies syncope sx and reports recently she has been feeling well.  Her only c/o today is neck pain after they put her in a c-collar.  She denies weakness or numbness or N/V/headache.  No anticoagulation other than '81mg'$  aspirin.  The history is provided by the patient and medical records.  Fall       Home Medications Prior to Admission medications   Medication Sig Start Date End Date Taking? Authorizing Provider  acetaminophen (TYLENOL) 650 MG CR tablet Take 650-1,300 mg by mouth daily as needed for pain.    [provider]  Ascorbic Acid (VITAMIN C PO) Take 1 tablet by mouth daily with lunch.    [provider]  aspirin EC 81 MG tablet Take 81 mg by mouth daily with lunch. Swallow whole. 11/23/20   Leonie Man, MD  Calcium Carbonate-Vitamin D (CALCIUM-D PO) Take 1 tablet by mouth daily with lunch.    [provider]  losartan (COZAAR) 25 MG tablet TAKE 1 TABLET BY MOUTH EVERY DAY Patient taking differently: Take 25 mg by mouth daily with lunch. 02/14/22   Leonie Man, MD  memantine (NAMENDA) 10 MG tablet Take 1 tablet (10 mg total) by mouth 2 (two) times daily. 06/25/22   Suzzanne Cloud, NP  metoprolol succinate (TOPROL-XL) 50 MG 24 hr tablet TAKE 1 TABLET BY MOUTH AT [facility] AND AT BEDTIME--D/C '100MG'$  06/25/22   Leonie Man, MD  Omega-3 Fatty Acids (FISH OIL PO) Take 1 capsule by mouth daily with lunch.    [provider]  POTASSIUM PO Take 1 tablet by mouth daily with  lunch.    [provider]  Probiotic Product (PROBIOTIC PO) Take 1 tablet by mouth daily with lunch.    [provider]  TURMERIC PO Take 1 capsule by mouth daily with lunch.    [provider]      Allergies    Patient has no known allergies.    Review of Systems   Review of Systems  Physical Exam Updated Vital Signs BP (!) 144/71 (BP Location: Left Arm)   Pulse 66   Temp 98 F (36.7 C) (Oral)   Resp 16   Ht '5\' 4"'$  (1.626 m)   Wt 63 kg   SpO2 97%   BMI 23.84 kg/m  Physical Exam Vitals and nursing note reviewed.  Constitutional:      General: She is not in acute distress.    Appearance: She is well-developed.  HENT:     Head: Normocephalic. Laceration present.   Eyes:     Pupils: Pupils are equal, round, and reactive to light.  Cardiovascular:     Rate and Rhythm: Normal rate and regular rhythm.     Heart sounds: Normal heart sounds. No murmur heard.    No friction rub.  Pulmonary:     Effort: Pulmonary effort is normal.     Breath sounds: Normal  breath sounds. No wheezing or rales.  Abdominal:     General: Bowel sounds are normal. There is no distension.     Palpations: Abdomen is soft.     Tenderness: There is no abdominal tenderness. There is no guarding or rebound.  Musculoskeletal:        General: No tenderness. Normal range of motion.     Cervical back: Normal range of motion and neck supple. No tenderness.     Comments: No edema  Skin:    General: Skin is warm and dry.     Findings: No rash.  Neurological:     Mental Status: She is alert and oriented to person, place, and time. Mental status is at baseline.     Cranial Nerves: No cranial nerve deficit.     Sensory: No sensory deficit.     Motor: No weakness.     Gait: Gait normal.  Psychiatric:        Mood and Affect: Mood normal.        Behavior: Behavior normal.     ED Results / Procedures / Treatments   Labs (all labs ordered are listed, but only abnormal results are  displayed) Labs Reviewed  URINALYSIS, ROUTINE W REFLEX MICROSCOPIC - Abnormal; Notable for the following components:      Result Value   Hgb urine dipstick MODERATE (*)    Ketones, ur 5 (*)    Leukocytes,Ua TRACE (*)    Bacteria, UA RARE (*)    All other components within normal limits    EKG None  Radiology CT Head Wo Contrast  Result Date: 11/27/2022 CLINICAL DATA:  Provided history: Head trauma, minor. Neck trauma. Mechanical fall. Laceration to left side of head. EXAM: CT HEAD WITHOUT CONTRAST CT CERVICAL SPINE WITHOUT CONTRAST TECHNIQUE: Multidetector CT imaging of the head and cervical spine was performed following the standard protocol without intravenous contrast. Multiplanar CT image reconstructions of the cervical spine were also generated. RADIATION DOSE REDUCTION: This exam was performed according to the departmental dose-optimization program which includes automated exposure control, adjustment of the mA and/or kV according to patient size and/or use of iterative reconstruction technique. COMPARISON:  Head CT 05/25/2022. FINDINGS: CT HEAD FINDINGS Brain: Moderate generalized cerebral atrophy. Mild ill-defined hypoattenuation within the cerebral white matter, nonspecific but compatible with chronic small vessel disease. There is no acute intracranial hemorrhage. No demarcated cortical infarct. No extra-axial fluid collection. No evidence of an intracranial mass. No midline shift. Vascular: No hyperdense vessel. Atherosclerotic calcifications. Skull: No fracture or aggressive osseous lesion. Sinuses/Orbits: No mass or acute finding within the imaged orbits. Mild mucosal thickening, and small mucous retention cyst, within the right maxillary sinus at the imaged levels. Fluid, and moderate-to-advanced mucosal thickening, within the left maxillary sinus at the imaged levels. Other: Left parietal scalp laceration. CT CERVICAL SPINE FINDINGS Alignment: 3 mm C4-C5 grade 1 anterolisthesis.  Skull base and vertebrae: The basion-dental and atlanto-dental intervals are maintained.No evidence of acute fracture to the cervical spine. Soft tissues and spinal canal: No prevertebral fluid or swelling. No visible canal hematoma. Disc levels: Cervical spondylosis with multilevel disc space narrowing, disc bulges/central disc protrusions, endplate spurring, uncovertebral hypertrophy and facet arthrosis. Disc space narrowing is greatest at C5-C6 and C6-C7 (moderately advanced at these levels). No appreciable high-grade spinal canal stenosis. Multilevel bony neural foraminal narrowing. Upper chest: No consolidation within the imaged lung apices. No visible pneumothorax. IMPRESSION: CT head: 1. No evidence of acute intracranial abnormality. 2. Left parietal scalp laceration. 3.  Mild chronic small vessel ischemic changes within the cerebral white matter. 4. Moderate generalized cerebral atrophy. 5. Paranasal sinus disease at the imaged levels, as described. CT cervical spine: 1. No evidence of acute fracture to the cervical spine. 2. 3 mm C4-C5 grade 1 anterolisthesis. 3. Cervical spondylosis, as described. Electronically Signed   By: Kellie Simmering D.O.   On: 11/27/2022 10:18   CT Cervical Spine Wo Contrast  Result Date: 11/27/2022 CLINICAL DATA:  Provided history: Head trauma, minor. Neck trauma. Mechanical fall. Laceration to left side of head. EXAM: CT HEAD WITHOUT CONTRAST CT CERVICAL SPINE WITHOUT CONTRAST TECHNIQUE: Multidetector CT imaging of the head and cervical spine was performed following the standard protocol without intravenous contrast. Multiplanar CT image reconstructions of the cervical spine were also generated. RADIATION DOSE REDUCTION: This exam was performed according to the departmental dose-optimization program which includes automated exposure control, adjustment of the mA and/or kV according to patient size and/or use of iterative reconstruction technique. COMPARISON:  Head CT 05/25/2022.  FINDINGS: CT HEAD FINDINGS Brain: Moderate generalized cerebral atrophy. Mild ill-defined hypoattenuation within the cerebral white matter, nonspecific but compatible with chronic small vessel disease. There is no acute intracranial hemorrhage. No demarcated cortical infarct. No extra-axial fluid collection. No evidence of an intracranial mass. No midline shift. Vascular: No hyperdense vessel. Atherosclerotic calcifications. Skull: No fracture or aggressive osseous lesion. Sinuses/Orbits: No mass or acute finding within the imaged orbits. Mild mucosal thickening, and small mucous retention cyst, within the right maxillary sinus at the imaged levels. Fluid, and moderate-to-advanced mucosal thickening, within the left maxillary sinus at the imaged levels. Other: Left parietal scalp laceration. CT CERVICAL SPINE FINDINGS Alignment: 3 mm C4-C5 grade 1 anterolisthesis. Skull base and vertebrae: The basion-dental and atlanto-dental intervals are maintained.No evidence of acute fracture to the cervical spine. Soft tissues and spinal canal: No prevertebral fluid or swelling. No visible canal hematoma. Disc levels: Cervical spondylosis with multilevel disc space narrowing, disc bulges/central disc protrusions, endplate spurring, uncovertebral hypertrophy and facet arthrosis. Disc space narrowing is greatest at C5-C6 and C6-C7 (moderately advanced at these levels). No appreciable high-grade spinal canal stenosis. Multilevel bony neural foraminal narrowing. Upper chest: No consolidation within the imaged lung apices. No visible pneumothorax. IMPRESSION: CT head: 1. No evidence of acute intracranial abnormality. 2. Left parietal scalp laceration. 3. Mild chronic small vessel ischemic changes within the cerebral white matter. 4. Moderate generalized cerebral atrophy. 5. Paranasal sinus disease at the imaged levels, as described. CT cervical spine: 1. No evidence of acute fracture to the cervical spine. 2. 3 mm C4-C5 grade 1  anterolisthesis. 3. Cervical spondylosis, as described. Electronically Signed   By: Kellie Simmering D.O.   On: 11/27/2022 10:18    Procedures Procedures   LACERATION REPAIR Performed by: [employer] Authorized by: Blanchie Dessert Consent: Verbal consent obtained. Risks and benefits: risks, benefits and alternatives were discussed Consent given by: patient Patient identity confirmed: provided demographic data Prepped and Draped in normal sterile fashion Wound explored  Laceration Location: left parietal scalp  Laceration Length: 2cm  No Foreign Bodies seen or palpated  Anesthesia: none Irrigation method: syringe Amount of cleaning: standard  Skin closure: dermabond and hair knotted  Number of sutures:   Technique: dermabond  Patient tolerance: Patient tolerated the procedure well with no immediate complications.  Medications Ordered in ED Medications  Tdap (BOOSTRIX) injection 0.5 mL (has no administration in time range)    ED Course/ Medical Decision Making/ A&P  Medical Decision Making Amount and/or Complexity of Data Reviewed Labs: ordered. Decision-making details documented in ED Course. Radiology: ordered and independent interpretation performed. Decision-making details documented in ED Course.  Risk Prescription drug management.   Pt with multiple medical problems and comorbidities and presenting today with a complaint that caries a high risk for morbidity and mortality.  Here today with a fall and head injury.  Pt is awake and alert and well appearing.  Injury to the left parietal area.  Unclear cause of fall but most likely trip and balance related.  Pt denies systemic sx and lower suspicion for anemia, electrolyte abnormalities, meds or infection.  Not anticoagulated.  Wound repaired as above.  Tetanus updated as last vaccine was 2014.  I have independently visualized and interpreted pt's images today. CT head neg for intracranial  bleed and cervical spine neg for fracture. Patient's daughter arrived and reports she has seen her and she seemed normal recently but the last time she fell she had a UTI so she wanted to make sure that was checked.  She had blood work done at her doctor's office this week and everything was normal.  I independently interpreted patient's UA and there are no signs of infection today.  Findings discussed with the patient and her daughter.  At this time she is stable for discharge home.         Final Clinical Impression(s) / ED Diagnoses Final diagnoses:  Fall, initial encounter  Laceration of scalp, initial encounter    Rx / DC Orders ED Discharge Orders     None         Blanchie Dessert, MD 11/27/22 1139

## 2022-11-27 NOTE — Discharge Instructions (Addendum)
Is okay to shower and wash her hair.  Just do not scrub directly on the glue.  When it is ready it will peel off on its own.  No signs of urinary tract infection today

## 2022-12-13 IMAGING — CR DG HIP (WITH OR WITHOUT PELVIS) 2-3V*R*
3 series · 3 of 3 positions shown · non-contrast
Comparison: CT abdomen and pelvis 07/13/2015

CLINICAL DATA: Fall, difficult weight-bearing

EXAM:
DG HIP (WITH OR WITHOUT PELVIS) 2-3V RIGHT

[t pelvis ap]
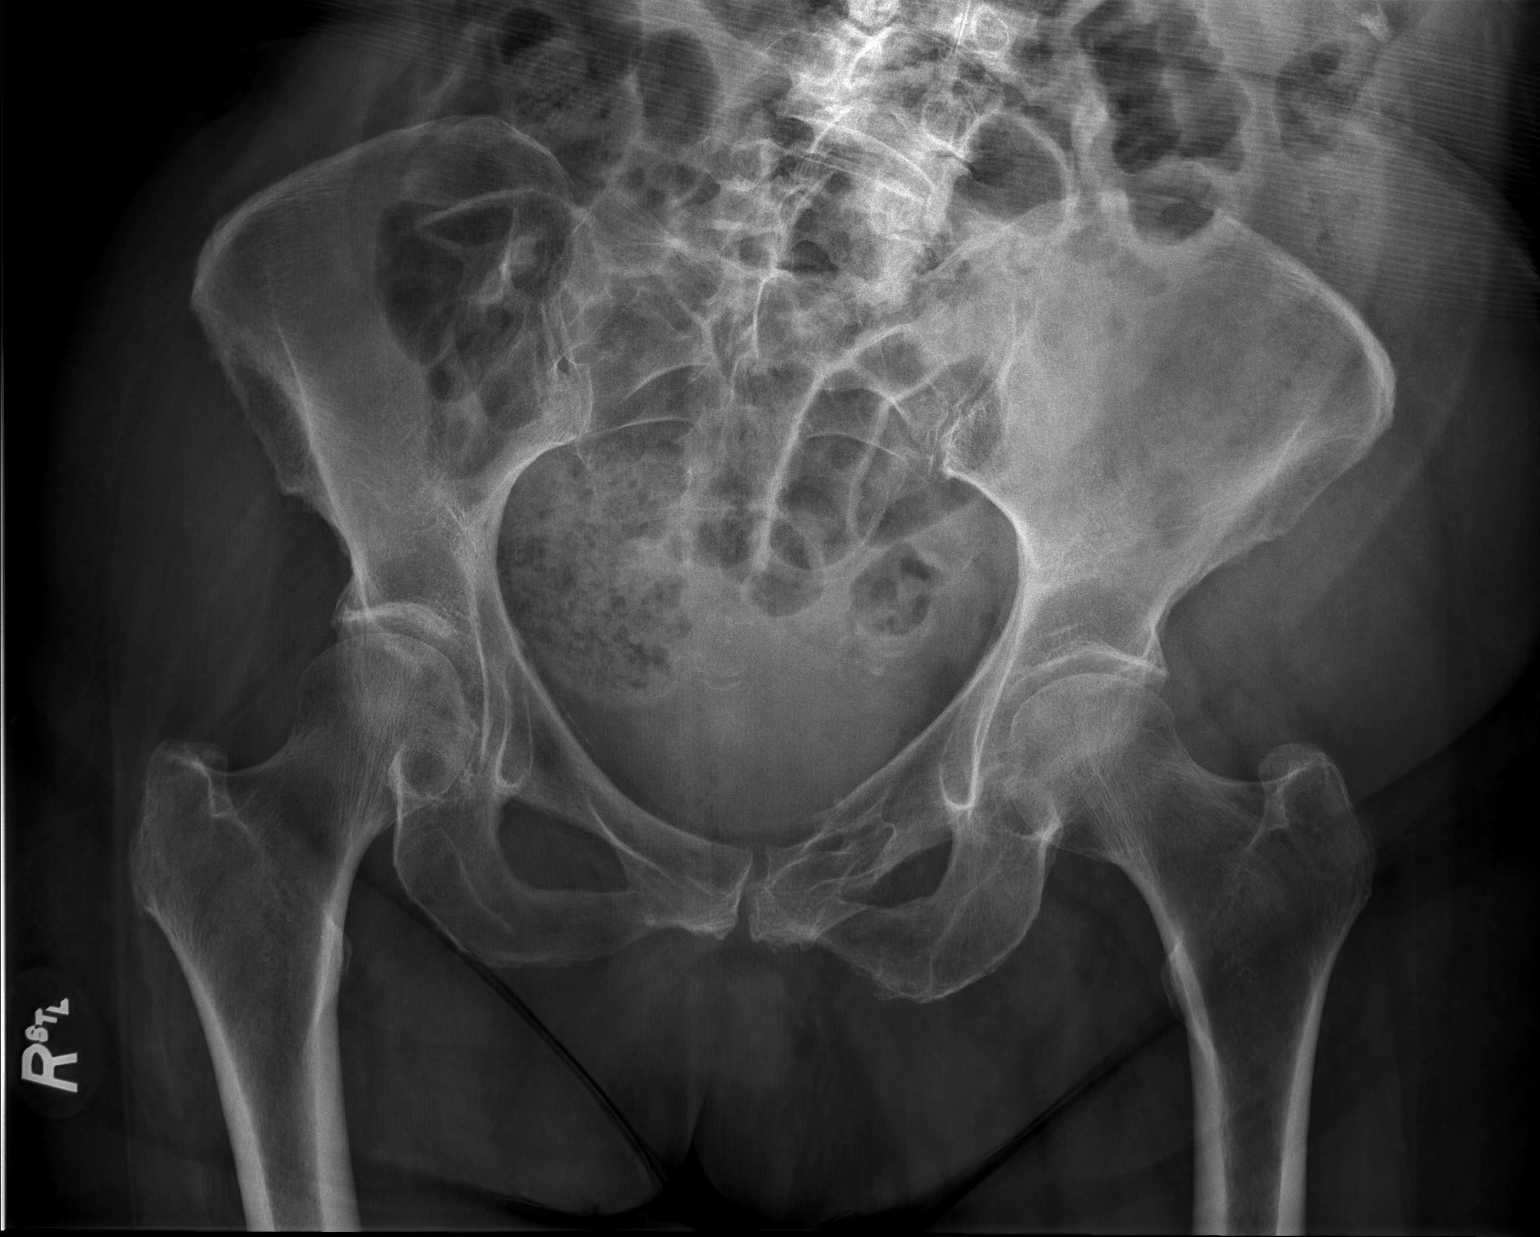

[t hip ap right]
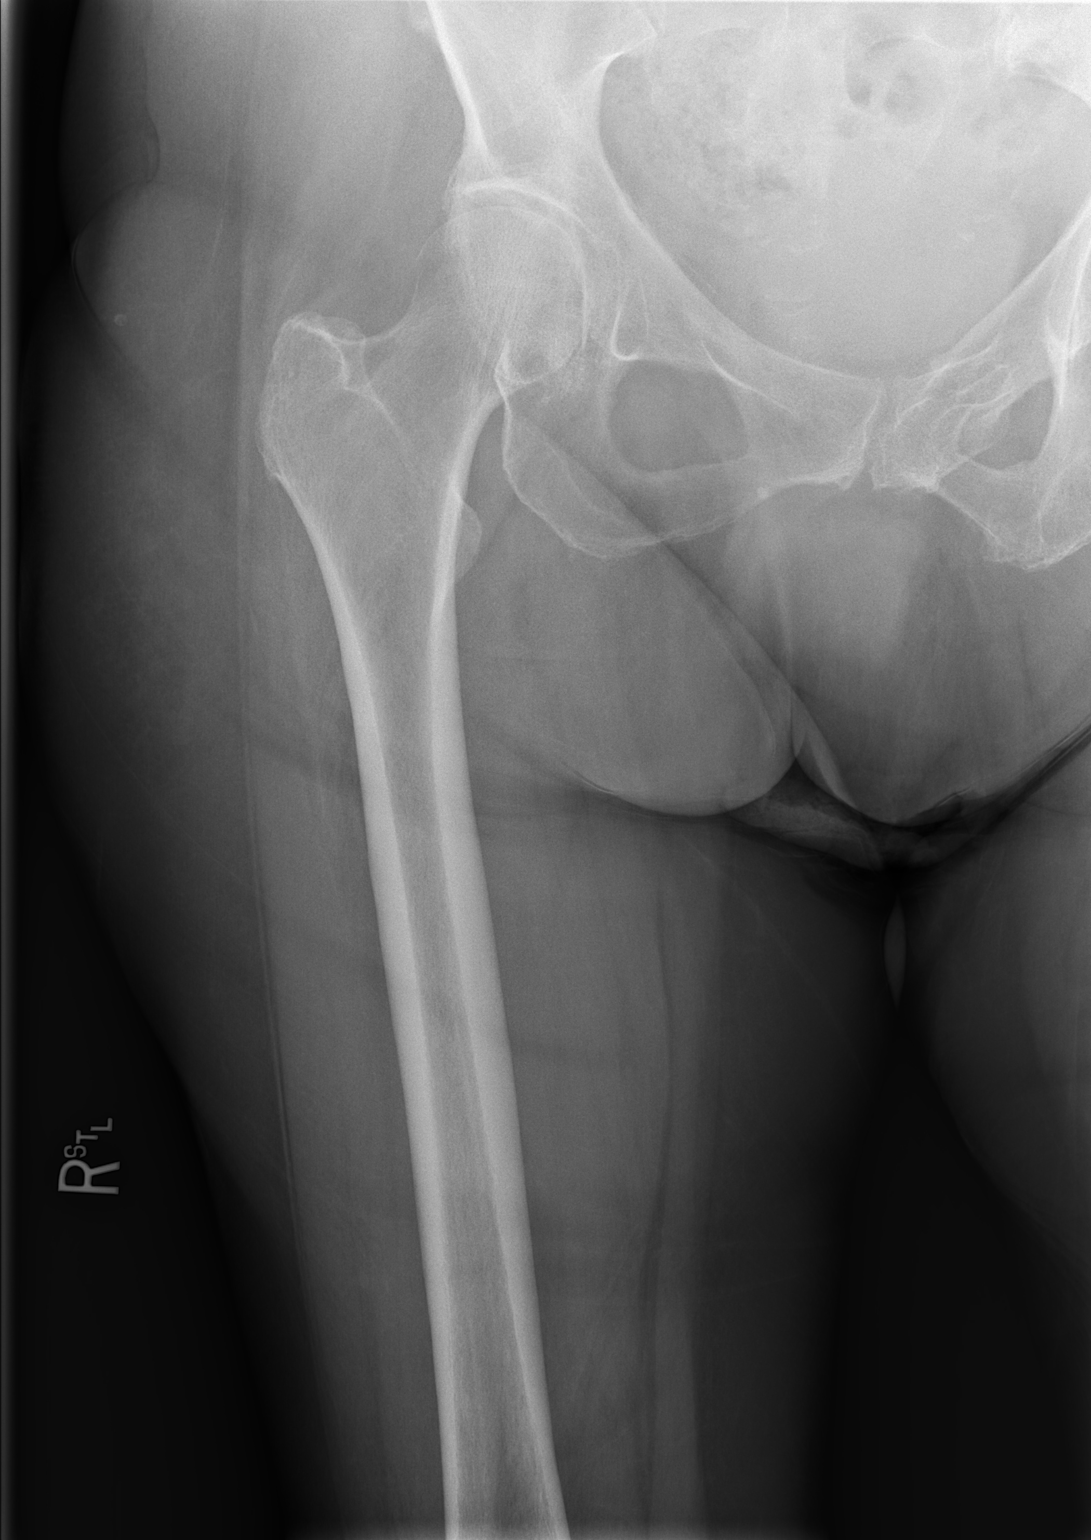

[t hip frog leg right]
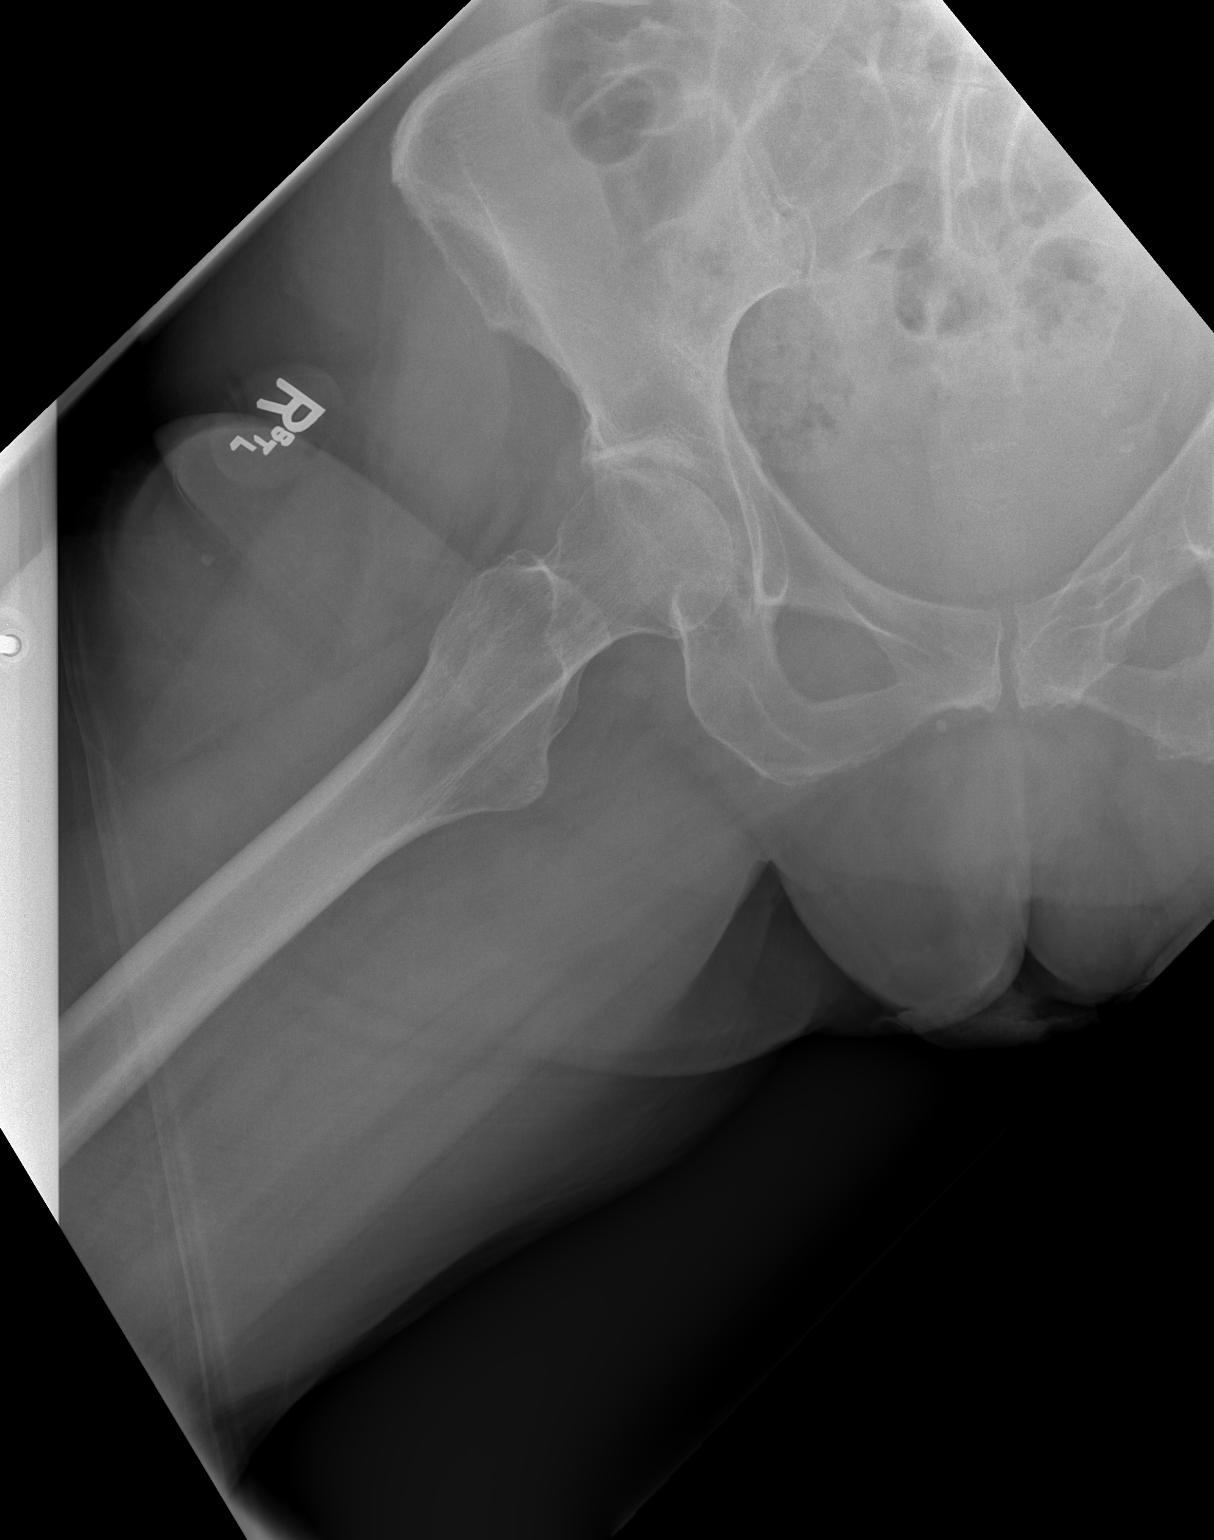

[3 of 3 positions shown; findings below may reference images not displayed]

FINDINGS: No acute fracture or dislocation identified in the right hip. Joint
space is preserved. Old fracture deformities of the left pubic rami.
IMPRESSION: No acute osseous abnormality identified.

## 2022-12-31 ENCOUNTER — Ambulatory Visit: Payer: Medicare HMO | Admitting: Podiatry

## 2022-12-31 DIAGNOSIS — M2042 Other hammer toe(s) (acquired), left foot: Secondary | ICD-10-CM

## 2022-12-31 DIAGNOSIS — M2041 Other hammer toe(s) (acquired), right foot: Secondary | ICD-10-CM | POA: Diagnosis not present

## 2022-12-31 DIAGNOSIS — B351 Tinea unguium: Secondary | ICD-10-CM

## 2022-12-31 DIAGNOSIS — M2011 Hallux valgus (acquired), right foot: Secondary | ICD-10-CM

## 2022-12-31 DIAGNOSIS — M79675 Pain in left toe(s): Secondary | ICD-10-CM | POA: Diagnosis not present

## 2022-12-31 DIAGNOSIS — M2012 Hallux valgus (acquired), left foot: Secondary | ICD-10-CM

## 2022-12-31 DIAGNOSIS — M79674 Pain in right toe(s): Secondary | ICD-10-CM | POA: Diagnosis not present

## 2022-12-31 DIAGNOSIS — M21611 Bunion of right foot: Secondary | ICD-10-CM | POA: Diagnosis not present

## 2022-12-31 DIAGNOSIS — M21612 Bunion of left foot: Secondary | ICD-10-CM

## 2022-12-31 NOTE — Patient Instructions (Signed)
More silicone pads can be purchased from:  [email or web address]

## 2023-01-04 NOTE — Progress Notes (Signed)
  Subjective:  Patient ID: Heather Knapp, female    DOB: [birth year],  MRN: [id number]  Chief Complaint  Patient presents with   Nail Problem    The toe that was operated on continues to hurt my mom. The toe separator doesn't help. Any suggestions would be great. She also needs her toenails trimmed and filed.    89 y.o. female presents with the above complaint. History confirmed with patient.  She returns today for follow-up from her left second hammertoe surgery.  The toe is still deformed and does not point straight.  It seems to want to crossover the big toe.  Still causing some discomfort.  The nails are thickened elongated and painful again  Objective:  Physical Exam: warm, good capillary refill, no trophic changes or ulcerative lesions, normal DP and PT pulses, normal sensory exam, and bilaterally she has hammertoe deformities that are semirigid in nature.  She has hallux valgus deformity with a second toe crossing over, left worse than right, no ulcerating lesions Left Foot: dystrophic yellowed discolored nail plates with subungual debris Right Foot: dystrophic yellowed discolored nail plates with subungual debris  Assessment:   1. Pain due to onychomycosis of toenails of both feet   2. Hammertoe of left foot   3. Hallux valgus with bunions, left   4. Hallux valgus with bunions, right   5. Hammertoe of right foot      Plan:  Patient was evaluated and treated and all questions answered.  Discussed the etiology and treatment options for the condition in detail with the patient. Educated patient on the topical and oral treatment options for mycotic nails. Recommended debridement of the nails today. Sharp and mechanical debridement performed of all painful and mycotic nails today. Nails debrided in length and thickness using a nail nipper to level of comfort. Discussed treatment options including appropriate shoe gear. Follow up as needed for painful nails.  Continues to have issues  with the toe.  I dispensed more silicone offloading pads they will purchase these here today.  Elective termination may be a definitive treatment if this does not improve or is causing pain but to my exam does not seem to be causing pain or ulceration on the calluses   No follow-ups on file.

## 2023-03-30 ENCOUNTER — Other Ambulatory Visit: Payer: Self-pay | Admitting: Cardiology

## 2023-04-26 ENCOUNTER — Other Ambulatory Visit: Payer: Self-pay | Admitting: Cardiology

## 2023-05-09 ENCOUNTER — Other Ambulatory Visit: Payer: Self-pay | Admitting: Cardiology

## 2023-05-22 DIAGNOSIS — I5032 Chronic diastolic (congestive) heart failure: Secondary | ICD-10-CM | POA: Diagnosis not present

## 2023-05-22 DIAGNOSIS — C9110 Chronic lymphocytic leukemia of B-cell type not having achieved remission: Secondary | ICD-10-CM | POA: Diagnosis not present

## 2023-05-22 DIAGNOSIS — D696 Thrombocytopenia, unspecified: Secondary | ICD-10-CM | POA: Diagnosis not present

## 2023-05-22 DIAGNOSIS — I10 Essential (primary) hypertension: Secondary | ICD-10-CM | POA: Diagnosis not present

## 2023-05-22 DIAGNOSIS — F0390 Unspecified dementia without behavioral disturbance: Secondary | ICD-10-CM | POA: Diagnosis not present

## 2023-05-22 DIAGNOSIS — I700 Atherosclerosis of aorta: Secondary | ICD-10-CM | POA: Diagnosis not present

## 2023-05-22 DIAGNOSIS — I110 Hypertensive heart disease with heart failure: Secondary | ICD-10-CM | POA: Diagnosis not present

## 2023-05-22 DIAGNOSIS — Z6827 Body mass index (BMI) 27.0-27.9, adult: Secondary | ICD-10-CM | POA: Diagnosis not present

## 2023-05-22 DIAGNOSIS — R413 Other amnesia: Secondary | ICD-10-CM | POA: Diagnosis not present

## 2023-06-04 ENCOUNTER — Ambulatory Visit (INDEPENDENT_AMBULATORY_CARE_PROVIDER_SITE_OTHER): Payer: Medicare HMO | Admitting: Podiatry

## 2023-06-04 DIAGNOSIS — M25872 Other specified joint disorders, left ankle and foot: Secondary | ICD-10-CM
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
## 2023-06-30 ENCOUNTER — Other Ambulatory Visit: Payer: Self-pay | Admitting: Neurology

## 2023-07-08 ENCOUNTER — Inpatient Hospital Stay: Payer: Medicare HMO | Admitting: Hematology & Oncology

## 2023-07-08 ENCOUNTER — Inpatient Hospital Stay: Payer: Medicare HMO

## 2023-08-14 ENCOUNTER — Other Ambulatory Visit: Payer: Self-pay | Admitting: *Deleted

## 2023-08-14 DIAGNOSIS — C9110 Chronic lymphocytic leukemia of B-cell type not having achieved remission: Secondary | ICD-10-CM

## 2023-08-17 ENCOUNTER — Inpatient Hospital Stay: Payer: Medicare HMO

## 2023-08-17 ENCOUNTER — Other Ambulatory Visit: Payer: Self-pay

## 2023-08-17 ENCOUNTER — Encounter: Payer: Self-pay | Admitting: Hematology & Oncology

## 2023-08-17 ENCOUNTER — Inpatient Hospital Stay: Payer: Medicare HMO | Attending: Hematology & Oncology | Admitting: Hematology & Oncology

## 2023-08-17 VITALS — BP 139/43 | HR 83 | Temp 98.0°F | Resp 19 | Ht 64.17 in | Wt 144.1 lb

## 2023-08-17 DIAGNOSIS — Z79899 Other long term (current) drug therapy: Secondary | ICD-10-CM | POA: Insufficient documentation

## 2023-08-17 DIAGNOSIS — Z853 Personal history of malignant neoplasm of breast: Secondary | ICD-10-CM | POA: Diagnosis not present

## 2023-08-17 DIAGNOSIS — C9110 Chronic lymphocytic leukemia of B-cell type not having achieved remission: Secondary | ICD-10-CM

## 2023-08-17 LAB — LACTATE DEHYDROGENASE: LDH: 148 U/L (ref 98–192)

## 2023-08-17 LAB — CBC WITH DIFFERENTIAL (CANCER CENTER ONLY)
Abs Immature Granulocytes: 0.02 10*3/uL (ref 0.00–0.07)
Basophils Absolute: 0.1 10*3/uL (ref 0.0–0.1)
Basophils Relative: 0 %
Eosinophils Absolute: 0 10*3/uL (ref 0.0–0.5)
Eosinophils Relative: 0 %
HCT: 39.7 % (ref 36.0–46.0)
Hemoglobin: 13.5 g/dL (ref 12.0–15.0)
Immature Granulocytes: 0 %
Lymphocytes Relative: 82 %
Lymphs Abs: 16.6 10*3/uL — ABNORMAL HIGH (ref 0.7–4.0)
MCH: 32.6 pg (ref 26.0–34.0)
MCHC: 34 g/dL (ref 30.0–36.0)
MCV: 95.9 fL (ref 80.0–100.0)
Monocytes Absolute: 0.3 10*3/uL (ref 0.1–1.0)
Monocytes Relative: 2 %
Neutro Abs: 3.2 10*3/uL (ref 1.7–7.7)
Neutrophils Relative %: 16 %
Platelet Count: 105 10*3/uL — ABNORMAL LOW (ref 150–400)
RBC: 4.14 MIL/uL (ref 3.87–5.11)
RDW: 13.2 % (ref 11.5–15.5)
Smear Review: NORMAL
WBC Count: 20.2 10*3/uL — ABNORMAL HIGH (ref 4.0–10.5)
nRBC: 0 % (ref 0.0–0.2)

## 2023-08-17 LAB — CMP (CANCER CENTER ONLY)
ALT: 11 U/L (ref 0–44)
AST: 14 U/L — ABNORMAL LOW (ref 15–41)
Albumin: 4.4 g/dL (ref 3.5–5.0)
Alkaline Phosphatase: 43 U/L (ref 38–126)
Anion gap: 8 (ref 5–15)
BUN: 11 mg/dL (ref 8–23)
CO2: 26 mmol/L (ref 22–32)
Calcium: 9.2 mg/dL (ref 8.9–10.3)
Chloride: 99 mmol/L (ref 98–111)
Creatinine: 0.81 mg/dL (ref 0.44–1.00)
GFR, Estimated: >60 mL/min (ref >60)
Glucose, Bld: 109 mg/dL — ABNORMAL HIGH (ref 70–99)
Potassium: 4.8 mmol/L (ref 3.5–5.1)
Sodium: 133 mmol/L — ABNORMAL LOW (ref 135–145)
Total Bilirubin: 0.6 mg/dL (ref 0.3–1.2)
Total Protein: 5.9 g/dL — ABNORMAL LOW (ref 6.5–8.1)

## 2023-08-17 NOTE — Progress Notes (Signed)
Hematology and Oncology Follow Up Visit  Heather Knapp [id number] 1934/06/05 89 y.o. 08/17/2023   Principle Diagnosis:  Stage a CLL Stage I (T1N0M0) infiltrating ductal carcinoma of the left breast   Current Therapy:        Observation   Interim History:  Heather Knapp is here today with her daughter for follow-up.  It has been a year since I last saw her.  She does seem to be doing pretty well.  She is still living at home.  She is still pretty much self sufficient.  She has a dementia.  However, she seems to be managing pretty well with this.\  She has had no problems with infections.  She has had no problems with bleeding.  There has been no obvious change with bowel or bladder habits.  She has had no nausea or vomiting.  She has had no swollen lymph nodes.  Currently, I would have said that her performance status is probably ECOG 2.  Medications:  Allergies as of 08/17/2023   No Known Allergies      Medication List        Accurate as of August 17, 2023  4:07 PM. If you have any questions, ask your nurse or doctor.          acetaminophen 650 MG CR tablet Commonly known as: TYLENOL Take 650-1,300 mg by mouth daily as needed for pain.   aspirin EC 81 MG tablet Take 81 mg by mouth daily with lunch. Swallow whole.   CALCIUM-D PO Take 1 tablet by mouth daily with lunch.   FISH OIL PO Take 1 capsule by mouth daily with lunch.   losartan 25 MG tablet Commonly known as: COZAAR TAKE 1 TABLET BY MOUTH EVERY DAY   memantine 10 MG tablet Commonly known as: NAMENDA TAKE 1 TABLET BY MOUTH TWICE A DAY   metoprolol succinate 50 MG 24 hr tablet Commonly known as: TOPROL-XL TAKE 1 TABLET BY MOUTH AT NOON AND AT BEDTIME--D/C 100MG    POTASSIUM PO Take 1 tablet by mouth daily with lunch.   PROBIOTIC PO Take 1 tablet by mouth daily with lunch.   TURMERIC PO Take 1 capsule by mouth daily with lunch.   VITAMIN C PO Take 1 tablet by mouth daily with lunch.         Allergies: No Known Allergies  Past Medical History, Surgical history, Social history, and Family History were reviewed and updated.  Review of Systems: Review of Systems  Constitutional: Negative.   HENT: Negative.    Eyes: Negative.   Respiratory: Negative.    Cardiovascular: Negative.   Gastrointestinal: Negative.   Genitourinary: Negative.   Musculoskeletal: Negative.   Skin: Negative.   Neurological: Negative.   Endo/Heme/Allergies: Negative.   Psychiatric/Behavioral:  Positive for memory loss.      Physical Exam:  height is 5' 4.17" (1.63 m) and weight is 144 lb 1.9 oz (65.4 kg). Her oral temperature is 98 F (36.7 C). Her blood pressure is 139/43 (abnormal) and her pulse is 83. Her respiration is 19 and oxygen saturation is 100%.   Wt Readings from Last 3 Encounters:  08/17/23 144 lb 1.9 oz (65.4 kg)  11/27/22 138 lb 14.2 oz (63 kg)  07/04/22 140 lb 12.8 oz (63.9 kg)   Her vital signs show temperature of 98.  Pulse 83.  Blood pressure 139/43.  Weight is 144 pounds.    Lab Results  Component Value Date   WBC 20.2 (H) 08/17/2023  HGB 13.5 08/17/2023   HCT 39.7 08/17/2023   MCV 95.9 08/17/2023   PLT 105 (L) 08/17/2023   No results found for: "FERRITIN", "IRON", "TIBC", "UIBC", "IRONPCTSAT" Lab Results  Component Value Date   RETICCTPCT 2.0 06/25/2018   RBC 4.14 08/17/2023   RETICCTABS 65.0 10/23/2011   RETICCTABS 65.0 10/23/2011   RETICCTABS 65.0 10/23/2011   RETICCTABS 65.0 10/23/2011   No results found for: "KPAFRELGTCHN", "LAMBDASER", "KAPLAMBRATIO" Lab Results  Component Value Date   IGGSERUM 367 (L) 08/25/2014   IGA 21 (L) 08/25/2014   IGMSERUM 6 (L) 08/25/2014   Lab Results  Component Value Date   TOTALPROTELP 6.1 07/28/2011   ALBUMINELP 68.5 (H) 07/28/2011   A1GS 5.6 (H) 07/28/2011   A2GS 9.6 07/28/2011   BETS 5.8 07/28/2011   BETA2SER 3.5 07/28/2011   GAMS 7.0 (L) 07/28/2011   MSPIKE NOT DET 07/28/2011   SPEI * 07/28/2011      Chemistry      Component Value Date/Time   NA 133 (L) 08/17/2023 1501   NA 138 02/29/2016 1112   K 4.8 08/17/2023 1501   K 4.4 02/29/2016 1112   CL 99 08/17/2023 1501   CL 98 10/28/2013 0949   CO2 26 08/17/2023 1501   CO2 25 02/29/2016 1112   BUN 11 08/17/2023 1501   BUN 16.6 02/29/2016 1112   CREATININE 0.81 08/17/2023 1501   CREATININE 0.7 02/29/2016 1112      Component Value Date/Time   CALCIUM 9.2 08/17/2023 1501   CALCIUM 9.2 02/29/2016 1112   ALKPHOS 43 08/17/2023 1501   ALKPHOS 61 02/29/2016 1112   AST 14 (L) 08/17/2023 1501   AST 17 02/29/2016 1112   ALT 11 08/17/2023 1501   ALT 12 02/29/2016 1112   BILITOT 0.6 08/17/2023 1501   BILITOT 0.72 02/29/2016 1112       Impression and Plan: Heather Knapp is a very pleasant 89 yo caucasian female with CLL. She looks great and continues to do well.   At this point, I really think that we can let her go from the practice.  Her white cell count really has not changed much over the past 4 years.  I think that her platelet count has dropped a little bit but again I do not see that as a problem from my point of view.  We have been following her now for over 10 years easily.  Again, she is done incredibly well.  We really never have had to treat her which is certainly encouraging.  She can certainly come back to the office at any time.  I know that it can be sometimes challenging for her to get to the office.  She has the dementia.  Her daughter is doing a great job with her.  It has been a true pleasure to have helped Heather Knapp.  Thankfully, we really never had to do much with her.  It has always been so much fun talking with her.   Josph Macho, MD 9/9/20244:07 PM

## 2023-08-21 DIAGNOSIS — N390 Urinary tract infection, site not specified: Secondary | ICD-10-CM | POA: Diagnosis not present

## 2023-08-21 DIAGNOSIS — R102 Pelvic and perineal pain: Secondary | ICD-10-CM | POA: Diagnosis not present

## 2023-08-21 DIAGNOSIS — B379 Candidiasis, unspecified: Secondary | ICD-10-CM | POA: Diagnosis not present

## 2023-12-04 ENCOUNTER — Ambulatory Visit: Payer: Medicare HMO | Admitting: Podiatry

## 2023-12-18 ENCOUNTER — Ambulatory Visit (INDEPENDENT_AMBULATORY_CARE_PROVIDER_SITE_OTHER): Payer: Medicare HMO | Admitting: Podiatry

## 2023-12-18 ENCOUNTER — Encounter: Payer: Self-pay | Admitting: Podiatry

## 2023-12-18 DIAGNOSIS — B351 Tinea unguium: Secondary | ICD-10-CM | POA: Diagnosis not present

## 2023-12-18 DIAGNOSIS — M79675 Pain in left toe(s): Secondary | ICD-10-CM

## 2023-12-18 DIAGNOSIS — M79674 Pain in right toe(s): Secondary | ICD-10-CM | POA: Diagnosis not present

## 2023-12-18 DIAGNOSIS — M7752 Other enthesopathy of left foot: Secondary | ICD-10-CM

## 2023-12-18 MED ORDER — TRIAMCINOLONE ACETONIDE 10 MG/ML IJ SUSP
10.0000 mg | Freq: Once | INTRAMUSCULAR | Status: AC
  Administered 2023-12-18: 10 mg via INTRA_ARTICULAR

## 2023-12-21 NOTE — Progress Notes (Signed)
 Subjective:   Patient ID: Heather Knapp, female   DOB: 89 y.o.   MRN: [id number]   HPI Patient states she has developed fluid buildup around the second digit left that is painful when pressed and she has elongated nailbeds 1-5 both feet that are irritated and she cannot cut with thickness noted and pain   ROS      Objective:  Physical Exam  Inflammatory capsulitis second digit left fluid buildup around the joint surface along with mycotic nail infection with pain 1-5 both feet     Assessment:  H&P reviewed went ahead today did sterile prep injected the inner phalangeal joint digit to left 3 mg dexamethasone Kenalog  5 mg Xylocaine applied cushioning to the toe and debrided nailbeds 1-5 both feet no iatrogenic bleeding of     Plan:  Plan as above

## 2023-12-25 DIAGNOSIS — I5032 Chronic diastolic (congestive) heart failure: Secondary | ICD-10-CM | POA: Diagnosis not present

## 2023-12-25 DIAGNOSIS — I10 Essential (primary) hypertension: Secondary | ICD-10-CM | POA: Diagnosis not present

## 2023-12-25 DIAGNOSIS — Z6826 Body mass index (BMI) 26.0-26.9, adult: Secondary | ICD-10-CM | POA: Diagnosis not present

## 2023-12-25 DIAGNOSIS — C9110 Chronic lymphocytic leukemia of B-cell type not having achieved remission: Secondary | ICD-10-CM | POA: Diagnosis not present

## 2023-12-25 DIAGNOSIS — Z0000 Encounter for general adult medical examination without abnormal findings: Secondary | ICD-10-CM | POA: Diagnosis not present

## 2023-12-25 DIAGNOSIS — F0390 Unspecified dementia without behavioral disturbance: Secondary | ICD-10-CM | POA: Diagnosis not present

## 2024-04-04 DIAGNOSIS — L219 Seborrheic dermatitis, unspecified: Secondary | ICD-10-CM | POA: Diagnosis not present

## 2024-04-04 DIAGNOSIS — D239 Other benign neoplasm of skin, unspecified: Secondary | ICD-10-CM | POA: Diagnosis not present

## 2024-04-27 DIAGNOSIS — N760 Acute vaginitis: Secondary | ICD-10-CM | POA: Diagnosis not present

## 2024-06-17 ENCOUNTER — Encounter: Payer: Self-pay | Admitting: Podiatry

## 2024-06-17 ENCOUNTER — Ambulatory Visit: Admitting: Podiatry

## 2024-06-17 VITALS — BP 132/78 | HR 55 | Temp 97.3°F | Resp 16 | Ht 64.17 in | Wt 144.0 lb

## 2024-06-17 DIAGNOSIS — M79675 Pain in left toe(s): Secondary | ICD-10-CM

## 2024-06-17 DIAGNOSIS — M79674 Pain in right toe(s): Secondary | ICD-10-CM

## 2024-06-17 DIAGNOSIS — B351 Tinea unguium: Secondary | ICD-10-CM

## 2024-06-20 NOTE — Progress Notes (Signed)
 Subjective:   Patient ID: Heather Knapp, female   DOB: 89 y.o.   MRN: [id number]   HPI Patient presents with elongated nailbeds 1-5 both feet that are painful for her thick and she cannot cut.  Has slight lesions also   ROS      Objective:  Physical Exam  Neurovascular status unchanged with thick yellow brittle nailbeds 1-5 both feet that are painful when pressed and impossible for her to cut with lesion formation fourth digit     Assessment:  Chronic mycotic nail infection lesion formation     Plan:  Reviewed condition debrided nailbeds 1-5 both feet that were painful and I was able to do this without any iatrogenic bleeding and did courtesy debridement of lesion

## 2024-08-19 DIAGNOSIS — Z23 Encounter for immunization: Secondary | ICD-10-CM | POA: Diagnosis not present

## 2025-01-04 ENCOUNTER — Other Ambulatory Visit: Payer: Self-pay | Admitting: Hematology
